# Patient Record
Sex: Female | Born: 1985 | Smoking: Never smoker
Health system: Southern US, Community
[De-identification: ages and names within clinical notes are randomized; demographics above are authoritative.]

## PROBLEM LIST (undated history)

## (undated) ENCOUNTER — Inpatient Hospital Stay: Payer: Self-pay

## (undated) DIAGNOSIS — F419 Anxiety disorder, unspecified: Secondary | ICD-10-CM

## (undated) DIAGNOSIS — F32A Depression, unspecified: Secondary | ICD-10-CM

## (undated) DIAGNOSIS — D649 Anemia, unspecified: Secondary | ICD-10-CM

## (undated) DIAGNOSIS — N814 Uterovaginal prolapse, unspecified: Secondary | ICD-10-CM

## (undated) HISTORY — DX: Depression, unspecified: F32.A

## (undated) HISTORY — PX: CHOLECYSTECTOMY: SHX55

## (undated) HISTORY — DX: Anxiety disorder, unspecified: F41.9

## (undated) HISTORY — DX: Uterovaginal prolapse, unspecified: N81.4

---

## 2015-12-29 HISTORY — PX: WISDOM TOOTH EXTRACTION: SHX21

## 2020-04-11 DIAGNOSIS — O1490 Unspecified pre-eclampsia, unspecified trimester: Secondary | ICD-10-CM

## 2021-04-15 ENCOUNTER — Emergency Department
Admission: EM | Admit: 2021-04-15 | Discharge: 2021-04-15 | Disposition: A | Discharge status: Home / Self Care | Payer: Medicaid Other | Attending: Emergency Medicine | Admitting: Emergency Medicine

## 2021-04-15 ENCOUNTER — Other Ambulatory Visit: Payer: Self-pay

## 2021-04-15 DIAGNOSIS — T7840XA Allergy, unspecified, initial encounter: Secondary | ICD-10-CM | POA: Insufficient documentation

## 2021-04-15 DIAGNOSIS — J029 Acute pharyngitis, unspecified: Secondary | ICD-10-CM | POA: Diagnosis not present

## 2021-04-15 DIAGNOSIS — R590 Localized enlarged lymph nodes: Secondary | ICD-10-CM | POA: Insufficient documentation

## 2021-04-15 NOTE — Discharge Instructions (Signed)
You can continue with salt water gargles.  Take ibuprofen 600 mg every 6 hours as needed with food.  You may take Benadryl at nighttime and Claritin during the day.  Make sure you are drinking lots of fluids.  Return to the ER for any fevers worsening symptoms or urgent changes in your health

## 2021-04-15 NOTE — ED Provider Notes (Signed)
[facility] EMERGENCY DEPARTMENT Provider Note   CSN: [id number] Arrival date & time: 04/15/21  2208     History Chief Complaint  Patient presents with  . Sore Throat    [name] is a 35 y.o. female presents to the emergency department evaluation of sore throat.  She said a sore throat for a week.  No fevers, chills, body aches, cough congestion or runny nose.  She does have a some drainage in the posterior pharynx that she feels throughout the day.  She is got some relief with gargling salt water.  She has not take any other medications.  She has been on amoxicillin for a UTI.  Currently denies any urinary tract symptoms, abdominal pain or fevers.  She is tolerating p.o. well.  HPI     History reviewed. No pertinent past medical history.  There are no problems to display for this patient.   History reviewed. No pertinent surgical history.   OB History   No obstetric history on file.     No family history on file.  Social History   Tobacco Use  . Smoking status: Never Smoker  . Smokeless tobacco: Never Used  Substance Use Topics  . Alcohol use: Never  . Drug use: Never    Home Medications Prior to Admission medications   Not on File    Allergies    Patient has no allergy information on record.  Review of Systems   Review of Systems  Constitutional: Negative for chills, fatigue and fever.  HENT: Positive for postnasal drip and sore throat. Negative for congestion and trouble swallowing.   Respiratory: Negative for cough and shortness of breath.   Gastrointestinal: Negative for abdominal pain, nausea and vomiting.  Genitourinary: Negative for dysuria.  Skin: Negative for rash and wound.  Neurological: Negative for headaches.    Physical Exam Updated Vital Signs BP 111/68   Pulse (!) 50   Temp 97.8 F (36.6 C) (Oral)   Resp 16   Ht 5\' 1"  (1.549 m)   Wt 63.5 kg   SpO2 97%   BMI 26.45 kg/m   Physical  Exam Constitutional:      General: She is not in acute distress.    Appearance: She is well-developed.  HENT:     Head: Normocephalic and atraumatic.     Jaw: No trismus.     Right Ear: Hearing, ear canal and external ear normal.     Left Ear: Hearing, ear canal and external ear normal.     Nose: No rhinorrhea.     Mouth/Throat:     Mouth: No oral lesions.     Pharynx: Oropharynx is clear. Uvula midline. No pharyngeal swelling, oropharyngeal exudate, posterior oropharyngeal erythema or uvula swelling.     Tonsils: No tonsillar exudate or tonsillar abscesses.  Eyes:     General:        Right eye: No discharge.        Left eye: No discharge.     Conjunctiva/sclera: Conjunctivae normal.  Cardiovascular:     Rate and Rhythm: Normal rate and regular rhythm.     Heart sounds: Normal heart sounds.  Pulmonary:     Effort: Pulmonary effort is normal. No respiratory distress.     Breath sounds: Normal breath sounds. No stridor. No wheezing or rales.  Abdominal:     General: There is no distension.     Palpations: Abdomen is soft.     Tenderness: There is no abdominal tenderness.  Musculoskeletal:        General: No deformity. Normal range of motion.     Cervical back: Normal range of motion and neck supple.  Lymphadenopathy:     Cervical: Cervical adenopathy (mild posterior cervical lymphadenopathy) present.  Skin:    General: Skin is warm and dry.  Neurological:     General: No focal deficit present.     Mental Status: She is alert and oriented to person, place, and time.     Deep Tendon Reflexes: Reflexes are normal and symmetric.  Psychiatric:        Mood and Affect: Mood normal.        Behavior: Behavior normal.        Thought Content: Thought content normal.     ED Results / Procedures / Treatments   Labs (all labs ordered are listed, but only abnormal results are displayed) Labs Reviewed - No data to display  EKG None  Radiology No results  found.  Procedures Procedures   Medications Ordered in ED Medications - No data to display  ED Course  I have reviewed the triage vital signs and the nursing notes.  Pertinent labs & imaging results that were available during my care of the patient were reviewed by me and considered in my medical decision making (see chart for details).    MDM Rules/Calculators/A&P                          35 year old female with 1 week of sore throat.  No other symptoms except for postnasal drip.  She has gotten some relief with salt water gargles.  Physical exam shows no peritonsillar abscess, swelling, exudates.  Pharyngeal exam is normal.  She is tolerating p.o. well.  Mild posterior cervical lymphadenopathy.  Recommend patient take ibuprofen as needed for pain and start taking over-the-counter antihistamine.  This does not appear to be any type of bacterial pharyngitis.  She understands signs symptoms return to the ER for. Final Clinical Impression(s) / ED Diagnoses Final diagnoses:  Pharyngitis, unspecified etiology  Allergy, initial encounter    Rx / DC Orders ED Discharge Orders    None       [name] 04/15/21 2230    Delton Prairie, MD 04/16/21 2318

## 2021-04-15 NOTE — ED Triage Notes (Addendum)
Pt states pain to the back of her throat for aprox a week, pt states pain is on left side. Denies trouble swallowing or breathign. Denies cough/fever

## 2021-07-06 ENCOUNTER — Emergency Department
Admission: EM | Admit: 2021-07-06 | Discharge: 2021-07-06 | Discharge status: Against Medical Advice | Payer: Medicaid Other

## 2021-07-06 NOTE — ED Notes (Signed)
Called pt twice for triage without answer.

## 2021-07-06 NOTE — ED Notes (Signed)
Pt called again for triage without answer

## 2021-07-09 ENCOUNTER — Emergency Department: Payer: Medicaid Other

## 2021-07-09 ENCOUNTER — Encounter
Admission: EM | Disposition: A | Discharge status: Home / Self Care | Payer: Self-pay | Source: Home / Self Care | Attending: Emergency Medicine

## 2021-07-09 ENCOUNTER — Emergency Department: Payer: Medicaid Other | Admitting: Anesthesiology

## 2021-07-09 ENCOUNTER — Other Ambulatory Visit: Payer: Self-pay

## 2021-07-09 ENCOUNTER — Ambulatory Visit
Admission: EM | Admit: 2021-07-09 | Discharge: 2021-07-09 | Disposition: A | Discharge status: Home / Self Care | Payer: Medicaid Other | Attending: Emergency Medicine | Admitting: Emergency Medicine

## 2021-07-09 ENCOUNTER — Encounter: Payer: Self-pay | Admitting: Emergency Medicine

## 2021-07-09 DIAGNOSIS — N83201 Unspecified ovarian cyst, right side: Secondary | ICD-10-CM | POA: Diagnosis not present

## 2021-07-09 DIAGNOSIS — O99891 Other specified diseases and conditions complicating pregnancy: Secondary | ICD-10-CM | POA: Insufficient documentation

## 2021-07-09 DIAGNOSIS — K661 Hemoperitoneum: Secondary | ICD-10-CM

## 2021-07-09 DIAGNOSIS — Z98891 History of uterine scar from previous surgery: Secondary | ICD-10-CM | POA: Diagnosis not present

## 2021-07-09 DIAGNOSIS — N939 Abnormal uterine and vaginal bleeding, unspecified: Secondary | ICD-10-CM

## 2021-07-09 DIAGNOSIS — O00102 Left tubal pregnancy without intrauterine pregnancy: Secondary | ICD-10-CM | POA: Insufficient documentation

## 2021-07-09 DIAGNOSIS — Z3A01 Less than 8 weeks gestation of pregnancy: Secondary | ICD-10-CM | POA: Diagnosis not present

## 2021-07-09 DIAGNOSIS — O3481 Maternal care for other abnormalities of pelvic organs, first trimester: Secondary | ICD-10-CM | POA: Diagnosis not present

## 2021-07-09 HISTORY — PX: LAPAROSCOPIC OVARIAN CYSTECTOMY: SHX6248

## 2021-07-09 HISTORY — PX: DIAGNOSTIC LAPAROSCOPY WITH REMOVAL OF ECTOPIC PREGNANCY: SHX6449

## 2021-07-09 LAB — COMPREHENSIVE METABOLIC PANEL
ALT: 58 U/L — ABNORMAL HIGH (ref 0–44)
AST: 29 U/L (ref 15–41)
Albumin: 4.1 g/dL (ref 3.5–5.0)
Alkaline Phosphatase: 55 U/L (ref 38–126)
Anion gap: 10 (ref 5–15)
BUN: 8 mg/dL (ref 6–20)
CO2: 23 mmol/L (ref 22–32)
Calcium: 9.1 mg/dL (ref 8.9–10.3)
Chloride: 102 mmol/L (ref 98–111)
Creatinine, Ser: 0.6 mg/dL (ref 0.44–1.00)
GFR, Estimated: >60 mL/min (ref >60)
Glucose, Bld: 110 mg/dL — ABNORMAL HIGH (ref 70–99)
Potassium: 3.6 mmol/L (ref 3.5–5.1)
Sodium: 135 mmol/L (ref 135–145)
Total Bilirubin: 1.4 mg/dL — ABNORMAL HIGH (ref 0.3–1.2)
Total Protein: 7.5 g/dL (ref 6.5–8.1)

## 2021-07-09 LAB — CBC
HCT: 37.7 % (ref 36.0–46.0)
Hemoglobin: 13.1 g/dL (ref 12.0–15.0)
MCH: 32.6 pg (ref 26.0–34.0)
MCHC: 34.7 g/dL (ref 30.0–36.0)
MCV: 93.8 fL (ref 80.0–100.0)
Platelets: 259 10*3/uL (ref 150–400)
RBC: 4.02 MIL/uL (ref 3.87–5.11)
RDW: 13.8 % (ref 11.5–15.5)
WBC: 17.4 10*3/uL — ABNORMAL HIGH (ref 4.0–10.5)
nRBC: 0 % (ref 0.0–0.2)

## 2021-07-09 LAB — BPAM RBC
Blood Product Expiration Date: 202208112359
Blood Product Expiration Date: 202208122359
Blood Product Expiration Date: 202208122359
Blood Product Expiration Date: 202208122359
Unit Type and Rh: 5100
Unit Type and Rh: 5100
Unit Type and Rh: 5100
Unit Type and Rh: 5100

## 2021-07-09 LAB — PREPARE RBC (CROSSMATCH)

## 2021-07-09 LAB — TYPE AND SCREEN
ABO/RH(D): O POS
Antibody Screen: NEGATIVE
Unit division: 0
Unit division: 0
Unit division: 0
Unit division: 0

## 2021-07-09 LAB — URINALYSIS, COMPLETE (UACMP) WITH MICROSCOPIC
Bacteria, UA: NONE SEEN
Bilirubin Urine: NEGATIVE
Glucose, UA: NEGATIVE mg/dL
Hgb urine dipstick: NEGATIVE
Ketones, ur: 20 mg/dL — AB
Leukocytes,Ua: NEGATIVE
Nitrite: NEGATIVE
Protein, ur: 30 mg/dL — AB
Specific Gravity, Urine: 1.028 (ref 1.005–1.030)
pH: 6 (ref 5.0–8.0)

## 2021-07-09 LAB — ABO/RH: ABO/RH(D): O POS

## 2021-07-09 LAB — HCG, QUANTITATIVE, PREGNANCY: hCG, Beta Chain, Quant, S: 536 m[IU]/mL — ABNORMAL HIGH (ref <5)

## 2021-07-09 LAB — LIPASE, BLOOD: Lipase: 24 U/L (ref 11–51)

## 2021-07-09 LAB — POC URINE PREG, ED: Preg Test, Ur: POSITIVE — AB

## 2021-07-09 SURGERY — LAPAROSCOPY, WITH ECTOPIC PREGNANCY SURGICAL TREATMENT
Anesthesia: General | Site: Abdomen | Laterality: Right

## 2021-07-09 MED ORDER — DIPHENHYDRAMINE HCL 50 MG/ML IJ SOLN
INTRAMUSCULAR | Status: AC
  Administered 2021-07-09: 12.5 mg via INTRAVENOUS
  Filled 2021-07-09: qty 1

## 2021-07-09 MED ORDER — MIDAZOLAM HCL 2 MG/2ML IJ SOLN
INTRAMUSCULAR | Status: AC
  Filled 2021-07-09: qty 2

## 2021-07-09 MED ORDER — DEXMEDETOMIDINE (PRECEDEX) IN NS 20 MCG/5ML (4 MCG/ML) IV SYRINGE
PREFILLED_SYRINGE | INTRAVENOUS | Status: DC | PRN
  Administered 2021-07-09: 4 ug via INTRAVENOUS

## 2021-07-09 MED ORDER — OXYCODONE HCL 5 MG PO TABS: 5.0000 mg | ORAL_TABLET | Freq: Once | ORAL | Status: DC | PRN

## 2021-07-09 MED ORDER — LIDOCAINE HCL (CARDIAC) PF 100 MG/5ML IV SOSY
PREFILLED_SYRINGE | INTRAVENOUS | Status: DC | PRN
  Administered 2021-07-09: 100 mg via INTRAVENOUS

## 2021-07-09 MED ORDER — PROMETHAZINE HCL 25 MG/ML IJ SOLN
INTRAMUSCULAR | Status: AC
  Filled 2021-07-09: qty 1

## 2021-07-09 MED ORDER — PROPOFOL 10 MG/ML IV BOLUS
INTRAVENOUS | Status: DC | PRN
  Administered 2021-07-09: 150 mg via INTRAVENOUS

## 2021-07-09 MED ORDER — MORPHINE SULFATE (PF) 4 MG/ML IV SOLN
4.0000 mg | Freq: Once | INTRAVENOUS | Status: AC
  Administered 2021-07-09: 4 mg via INTRAVENOUS
  Filled 2021-07-09: qty 1

## 2021-07-09 MED ORDER — SODIUM CHLORIDE FLUSH 0.9 % IV SOLN
INTRAVENOUS | Status: AC
  Filled 2021-07-09: qty 6

## 2021-07-09 MED ORDER — KETOROLAC TROMETHAMINE 30 MG/ML IJ SOLN
INTRAMUSCULAR | Status: DC | PRN
  Administered 2021-07-09: 30 mg via INTRAVENOUS

## 2021-07-09 MED ORDER — BUPIVACAINE HCL 0.5 % IJ SOLN
INTRAMUSCULAR | Status: DC | PRN
  Administered 2021-07-09: 9 mL

## 2021-07-09 MED ORDER — ONDANSETRON HCL 4 MG/2ML IJ SOLN
4.0000 mg | Freq: Once | INTRAMUSCULAR | Status: AC
  Administered 2021-07-09: 4 mg via INTRAVENOUS
  Filled 2021-07-09: qty 2

## 2021-07-09 MED ORDER — ONDANSETRON HCL 4 MG/2ML IJ SOLN
INTRAMUSCULAR | Status: DC | PRN
  Administered 2021-07-09: 4 mg via INTRAVENOUS

## 2021-07-09 MED ORDER — ROCURONIUM BROMIDE 100 MG/10ML IV SOLN
INTRAVENOUS | Status: DC | PRN
  Administered 2021-07-09: 40 mg via INTRAVENOUS
  Administered 2021-07-09: 10 mg via INTRAVENOUS

## 2021-07-09 MED ORDER — LACTATED RINGERS IV SOLN: INTRAVENOUS | Status: DC | PRN

## 2021-07-09 MED ORDER — ACETAMINOPHEN 10 MG/ML IV SOLN
INTRAVENOUS | Status: DC | PRN
  Administered 2021-07-09: 1000 mg via INTRAVENOUS

## 2021-07-09 MED ORDER — FENTANYL CITRATE (PF) 100 MCG/2ML IJ SOLN
INTRAMUSCULAR | Status: AC
  Filled 2021-07-09: qty 2

## 2021-07-09 MED ORDER — SODIUM CHLORIDE 0.9 % IV SOLN: 10.0000 mL/h | Freq: Once | INTRAVENOUS | Status: DC

## 2021-07-09 MED ORDER — SUCCINYLCHOLINE CHLORIDE 20 MG/ML IJ SOLN
INTRAMUSCULAR | Status: DC | PRN
  Administered 2021-07-09: 120 mg via INTRAVENOUS

## 2021-07-09 MED ORDER — MIDAZOLAM HCL 2 MG/2ML IJ SOLN
INTRAMUSCULAR | Status: DC | PRN
  Administered 2021-07-09: 2 mg via INTRAVENOUS

## 2021-07-09 MED ORDER — SODIUM CHLORIDE FLUSH 0.9 % IV SOLN
INTRAVENOUS | Status: AC
  Filled 2021-07-09: qty 10

## 2021-07-09 MED ORDER — PROMETHAZINE HCL 25 MG/ML IJ SOLN
6.2500 mg | INTRAMUSCULAR | Status: AC
  Administered 2021-07-09: 6.25 mg via INTRAVENOUS

## 2021-07-09 MED ORDER — ACETAMINOPHEN 10 MG/ML IV SOLN: 1000.0000 mg | Freq: Once | INTRAVENOUS | Status: DC | PRN

## 2021-07-09 MED ORDER — FENTANYL CITRATE (PF) 100 MCG/2ML IJ SOLN
INTRAMUSCULAR | Status: DC | PRN
  Administered 2021-07-09 (×2): 50 ug via INTRAVENOUS

## 2021-07-09 MED ORDER — DEXAMETHASONE SODIUM PHOSPHATE 10 MG/ML IJ SOLN
INTRAMUSCULAR | Status: DC | PRN
  Administered 2021-07-09: 10 mg via INTRAVENOUS

## 2021-07-09 MED ORDER — ONDANSETRON HCL 4 MG/2ML IJ SOLN: 4.0000 mg | Freq: Once | INTRAMUSCULAR | Status: DC | PRN

## 2021-07-09 MED ORDER — PROPOFOL 10 MG/ML IV BOLUS
INTRAVENOUS | Status: AC
  Filled 2021-07-09: qty 20

## 2021-07-09 MED ORDER — SODIUM CHLORIDE 0.9 % IV SOLN: Freq: Once | INTRAVENOUS | Status: AC

## 2021-07-09 MED ORDER — SUGAMMADEX SODIUM 200 MG/2ML IV SOLN
INTRAVENOUS | Status: DC | PRN
  Administered 2021-07-09: 200 mg via INTRAVENOUS

## 2021-07-09 MED ORDER — OXYCODONE HCL 5 MG/5ML PO SOLN: 5.0000 mg | Freq: Once | ORAL | Status: DC | PRN

## 2021-07-09 MED ORDER — FENTANYL CITRATE (PF) 100 MCG/2ML IJ SOLN: 25.0000 ug | INTRAMUSCULAR | Status: DC | PRN

## 2021-07-09 MED ORDER — DIPHENHYDRAMINE HCL 50 MG/ML IJ SOLN: 12.5000 mg | INTRAMUSCULAR | Status: AC

## 2021-07-09 SURGICAL SUPPLY — 41 items
BACTOSHIELD CHG 4% 4OZ (MISCELLANEOUS) ×1
BAG URINE DRAIN 2000ML AR STRL (UROLOGICAL SUPPLIES) ×3 IMPLANT
BLADE SURG SZ11 CARB STEEL (BLADE) ×3 IMPLANT
CANISTER SUCT 1200ML W/VALVE (MISCELLANEOUS) ×3 IMPLANT
CATH ROBINSON RED A/P 16FR (CATHETERS) ×3 IMPLANT
CHLORAPREP W/TINT 26 (MISCELLANEOUS) ×3 IMPLANT
DERMABOND ADVANCED (GAUZE/BANDAGES/DRESSINGS) ×1
DERMABOND ADVANCED .7 DNX12 (GAUZE/BANDAGES/DRESSINGS) ×2 IMPLANT
DRSG TEGADERM 2-3/8X2-3/4 SM (GAUZE/BANDAGES/DRESSINGS) ×9 IMPLANT
GAUZE 4X4 16PLY ~~LOC~~+RFID DBL (SPONGE) ×3 IMPLANT
GLOVE SURG SYN 8.0 (GLOVE) ×6 IMPLANT
GOWN STRL REUS W/ TWL LRG LVL3 (GOWN DISPOSABLE) ×2 IMPLANT
GOWN STRL REUS W/ TWL XL LVL3 (GOWN DISPOSABLE) ×4 IMPLANT
GOWN STRL REUS W/TWL LRG LVL3 (GOWN DISPOSABLE) ×1
GOWN STRL REUS W/TWL XL LVL3 (GOWN DISPOSABLE) ×2
GRASPER SUT TROCAR 14GX15 (MISCELLANEOUS) ×3 IMPLANT
IRRIGATION STRYKERFLOW (MISCELLANEOUS) ×2 IMPLANT
IRRIGATOR STRYKERFLOW (MISCELLANEOUS) ×3
IV NS 1000ML (IV SOLUTION) ×1
IV NS 1000ML BAXH (IV SOLUTION) ×2 IMPLANT
KIT TURNOVER CYSTO (KITS) ×3 IMPLANT
LABEL OR SOLS (LABEL) ×3 IMPLANT
MANIFOLD NEPTUNE II (INSTRUMENTS) ×3 IMPLANT
NS IRRIG 500ML POUR BTL (IV SOLUTION) ×3 IMPLANT
PACK GYN LAPAROSCOPIC (MISCELLANEOUS) ×3 IMPLANT
PAD OB MATERNITY 4.3X12.25 (PERSONAL CARE ITEMS) ×3 IMPLANT
PAD PREP 24X41 OB/GYN DISP (PERSONAL CARE ITEMS) ×3 IMPLANT
POUCH SPECIMEN RETRIEVAL 10MM (ENDOMECHANICALS) ×3 IMPLANT
SCRUB CHG 4% DYNA-HEX 4OZ (MISCELLANEOUS) ×2 IMPLANT
SET TUBE SMOKE EVAC HIGH FLOW (TUBING) ×3 IMPLANT
SHEARS HARMONIC ACE PLUS 36CM (ENDOMECHANICALS) ×3 IMPLANT
SLEEVE ENDOPATH XCEL 5M (ENDOMECHANICALS) ×3 IMPLANT
SPONGE GAUZE 2X2 8PLY STRL LF (GAUZE/BANDAGES/DRESSINGS) ×9 IMPLANT
STRIP CLOSURE SKIN 1/4X4 (GAUZE/BANDAGES/DRESSINGS) ×3 IMPLANT
SUT VIC AB 0 CT1 36 (SUTURE) ×3 IMPLANT
SUT VIC AB 2-0 UR6 27 (SUTURE) IMPLANT
SUT VIC AB 4-0 SH 27 (SUTURE) ×1
SUT VIC AB 4-0 SH 27XANBCTRL (SUTURE) ×2 IMPLANT
SWABSTK COMLB BENZOIN TINCTURE (MISCELLANEOUS) ×3 IMPLANT
TROCAR ENDO BLADELESS 11MM (ENDOMECHANICALS) ×3 IMPLANT
TROCAR XCEL NON-BLD 5MMX100MML (ENDOMECHANICALS) ×3 IMPLANT

## 2021-07-09 NOTE — Anesthesia Preprocedure Evaluation (Signed)
Anesthesia Evaluation  Patient identified by MRN, date of birth, ID band Patient awake  General Assessment Comment:   Ruptured ectopic, in visible severe pain.  Reviewed: Allergy & Precautions, NPO status , Patient's Chart, lab work & pertinent test results  History of Anesthesia Complications Negative for: history of anesthetic complications  Airway Mallampati: II  TM Distance: >3 FB Neck ROM: Full    Dental  (+) Teeth Intact, Missing,    Pulmonary neg pulmonary ROS, neg sleep apnea, neg COPD, Patient abstained from smoking.Not current smoker,    Pulmonary exam normal breath sounds clear to auscultation       Cardiovascular Exercise Tolerance: Good METS(-) hypertension(-) CAD and (-) Past MI negative cardio ROS  (-) dysrhythmias  Rhythm:Regular Rate:Normal - Systolic murmurs    Neuro/Psych negative neurological ROS  negative psych ROS   GI/Hepatic neg GERD  ,(+)     (-) substance abuse  ,   Endo/Other  neg diabetes  Renal/GU negative Renal ROS     Musculoskeletal   Abdominal   Peds  Hematology   Anesthesia Other Findings History reviewed. No pertinent past medical history.  Reproductive/Obstetrics (+) Pregnancy                             Anesthesia Physical Anesthesia Plan  ASA: 2 and emergent  Anesthesia Plan: General   Post-op Pain Management:    Induction: Intravenous and Rapid sequence  PONV Risk Score and Plan: 4 or greater and Ondansetron, Dexamethasone and Midazolam  Airway Management Planned: Oral ETT  Additional Equipment: None  Intra-op Plan:   Post-operative Plan: Extubation in OR  Informed Consent: I have reviewed the patients History and Physical, chart, labs and discussed the procedure including the risks, benefits and alternatives for the proposed anesthesia with the patient or authorized representative who has indicated his/her understanding and  acceptance.     Dental advisory given  Plan Discussed with: CRNA and Surgeon  Anesthesia Plan Comments: (Discussed risks of anesthesia with patient, including PONV, sore throat, lip/dental damage. Rare risks discussed as well, such as cardiorespiratory and neurological sequelae. Patient understands.)        Anesthesia Quick Evaluation
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
## 2021-07-09 NOTE — ED Provider Notes (Signed)
Care assumed from patient's PA.  Ultrasound calls back showing what appears to be a ruptured ectopic.  Patient is in pain she currently is hemodynamically stable.  We will get a little bit of pain medicine for her and begin a type and cross etc. for preop.  I am paging Dr. Feliberto Gottron the OB/GYN right now.   Arnaldo Natal, MD 07/09/21 [phone]
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
## 2021-07-09 NOTE — Discharge Instructions (Addendum)
AMBULATORY SURGERY  DISCHARGE INSTRUCTIONS   The drugs that you were given will stay in your system until tomorrow so for the next 24 hours you should not:  Drive an automobile Make any legal decisions Drink any alcoholic beverage   You may resume regular meals tomorrow.  Today it is better to start with liquids and gradually work up to solid foods.  You may eat anything you prefer, but it is better to start with liquids, then soup and crackers, and gradually work up to solid foods.   Please notify your doctor immediately if you have any unusual bleeding, trouble breathing, redness and pain at the surgery site, drainage, fever, or pain not relieved by medication.     Please call to schedule your post-operative visit.  Additional Instructions:    

## 2021-07-09 NOTE — ED Notes (Signed)
Pt complaining of abdominal pain. Pt states pain is a 10. RN notified.

## 2021-07-09 NOTE — ED Notes (Signed)
Patient transported to Ultrasound 

## 2021-07-09 NOTE — ED Notes (Signed)
Patient transported to the OR 

## 2021-07-09 NOTE — ED Notes (Signed)
Blood consent signed by patient and this RN.  

## 2021-07-09 NOTE — Op Note (Signed)
[name], [name] MEDICAL RECORD NO: [id number] ACCOUNT NO: [id number] DATE OF BIRTH: 10-24-1986 FACILITY: [facility] LOCATION: [facility] PHYSICIAN: Suzy Bouchard, MD  Operative Report   DATE OF PROCEDURE: 07/09/2021  PREOPERATIVE DIAGNOSES: 1.  Ruptured left tubal ectopic pregnancy. 2.  Hemoperitoneum.  POSTOPERATIVE DIAGNOSES: 1.  Left tubal ectopic pregnancy. 2.  Pelvic adhesions. 3.  Right ovarian cyst.  PROCEDURE:  1.  Laparoscopic left salpingectomy. 2.  Lysis of adhesions. 3.  Right ovarian cystotomy.  ANESTHESIA:  General endotracheal anesthesia.  SURGEON:  Jennell Corner, MD  INDICATIONS:  A 35 year old gravida 6, para 5 patient presented to the emergency department with abdominal pain.  Positive pregnancy test, quantitative hCG of 530, an ultrasound that showed hemoperitoneum and a left adnexal mass concerning for an ectopic  pregnancy that was ruptured.  DESCRIPTION OF PROCEDURE:  After adequate general endotracheal anesthesia, the patient's abdomen, perineum and vagina were prepped and draped in sterile fashion.  Timeout was performed.  Straight catheterization of the bladder yielded 75 mL dark urine.   Sponge stick was placed into the posterior vaginal vault to be used for uterine manipulation during the procedure.  Gloves and gown were changed.  Attention was then directed to the patient's abdomen where a 5 mm infraumbilical incision was made after  injecting with 0.5% Marcaine.  The 5 mm laparoscope was advanced into the abdominal cavity under direct visualization with the Optiview cannula.  The patient's abdomen was insufflated.  There was noted to be an omental adhesion anteriorly inferior to the  umbilical port site.  A second port was placed left lower quadrant 3 cm medial to the left anterior iliac spine and an 11 mm trocar was advanced under direct visualization.  Third port site was placed right lower quadrant, again 3 cm medial to the right   anterior iliac spine.  A 5 mm trocar was advanced into the abdominal cavity under direct visualization.  Initial impression revealed free flowing non-clotted blood and a dilated violaceous left tube with bulging centrally consistent with an ectopic  pregnancy.  Harmonic scalpel was brought up to the operative field.  The omental adhesion was taken down followed by grasping of the distal portion of the left fallopian tube and placing this on medial traction.  Harmonic scalpel was used to remove the  left fallopian tube intact with the ectopic pregnancy.  The patient had a 4 cm right ovarian cyst.  Harmonic scalpel was used to open the cyst wall and clear fluid drained from this.  The patient's abdomen was irrigated and suctioned.  The Endobag was  placed in the abdominal cavity and the left fallopian tube with ectopic was removed through this. Intra-abdominal pressure was lowered to 7 mmHg.  Good hemostasis was noted.  The cone and PMI apparatus was used to close the left lower port site, closing  the fascia.  The patient's abdomen was then deflated and all ports were removed.  Each incision was closed with interrupted 4-0 Vicryl suture.  Sterile dressing applied.  Vaginal sponge stick was removed.  Intraoperative pictures were then printed.  The  patient tolerated the procedure well.  No complications.  ESTIMATED BLOOD LOSS:  Minimal with 50 mL hemoperitoneum that was encountered prior to the procedure.  INTRAOPERATIVE FLUIDS:  800 mL.  URINE OUTPUT:  75 mL.  The patient was taken to recovery room in good condition.   SHW D: 07/09/2021 7:03:54 pm T: 07/09/2021 8:39:00 pm  JOB: [id number]/ [id number]

## 2021-07-09 NOTE — ED Triage Notes (Signed)
Pt comes into the ED via POV c/o generalized abdominal pain and vaginal bleeding.  Pt states that she is about 6-[redacted] weeks pregnant and she just completed an [place] because they were concerned for an ectopic pregnancy.  Pt states they did not find a a fetus in the [place].  Pt coming back with continued pain.  Pt in NAd with even and unlabored respirations.  Pt also c/o body aches.

## 2021-07-09 NOTE — ED Notes (Signed)
Report given to OR nurse

## 2021-07-09 NOTE — ED Triage Notes (Signed)
First Nurse Note:  C/O vaginal bleeding and abdominal pain.  Patient has been seen for same through [facility].    Positive urine preg 7/8 and 7/10 HCG:  7/10 - 948 7/12 - 522 7/12 - 485  US done 7/10-- inconclusive for IUP vs ectopic

## 2021-07-09 NOTE — Transfer of Care (Signed)
Immediate Anesthesia Transfer of Care Note  Patient: [name]  Procedure(s) Performed: DIAGNOSTIC LAPAROSCOPY WITH REMOVAL OF ECTOPIC PREGNANCY; LEFT SALPINGECTOMY (Left: Abdomen) LAPAROSCOPIC OVARIAN CYSTECTOMY (Right)  Patient Location: PACU  Anesthesia Type:General  Level of Consciousness: drowsy and patient cooperative  Airway & Oxygen Therapy: Patient Spontanous Breathing and Patient connected to face mask oxygen  Post-op Assessment: Report given to RN and Post -op Vital signs reviewed and stable  Post vital signs: Reviewed and stable  Last Vitals:  Vitals Value Taken Time  BP 107/61 07/09/21 1856  Temp 36.7 C 07/09/21 1853  Pulse 76 07/09/21 1856  Resp 20 07/09/21 1856  SpO2 100 % 07/09/21 1856  Vitals shown include unvalidated device data.  Last Pain:  Vitals:   07/09/21 1716  TempSrc:   PainSc: 10-Worst pain ever         Complications: No notable events documented.

## 2021-07-09 NOTE — Anesthesia Procedure Notes (Signed)
Procedure Name: Intubation Date/Time: 07/09/2021 6:09 PM Performed by: Nelda Marseille, CRNA Pre-anesthesia Checklist: Patient identified, Patient being monitored, Timeout performed, Emergency Drugs available and Suction available Patient Re-evaluated:Patient Re-evaluated prior to induction Oxygen Delivery Method: Circle system utilized Preoxygenation: Pre-oxygenation with 100% oxygen Induction Type: IV induction Ventilation: Mask ventilation without difficulty Laryngoscope Size: Mac, 3 and McGraph Grade View: Grade I Tube type: Oral Tube size: 6.5 mm Number of attempts: 1 Airway Equipment and Method: Stylet Placement Confirmation: ETT inserted through vocal cords under direct vision, positive ETCO2 and breath sounds checked- equal and bilateral Secured at: 21 cm Tube secured with: Tape Dental Injury: Teeth and Oropharynx as per pre-operative assessment

## 2021-07-09 NOTE — Brief Op Note (Signed)
07/09/2021  6:38 PM  PATIENT:  [name]  35 y.o. female  PRE-OPERATIVE DIAGNOSIS:  Ruptured Ectopic Pregnancy, left tubal   POST-OPERATIVE DIAGNOSIS:  Ruptured Ectopic Pregnancy,left tubal   PROCEDURE:  Procedure(s): DIAGNOSTIC LAPAROSCOPY WITH REMOVAL OF ECTOPIC PREGNANCY (Left). Left salpingectomy , lysis of adhesions and right ovarian cystotomy   SURGEON:  Surgeon(s) and Role:    * Letica Giaimo, Ihor Austin, MD - Primary  PHYSICIAN ASSISTANT: CST  ASSISTANTS: none   ANESTHESIA:   general  EBL:  50 mL ( hemoperitoneum )  IOF- 800 cc UO 75 cc  BLOOD ADMINISTERED:none  DRAINS: none   LOCAL MEDICATIONS USED:  MARCAINE     SPECIMEN:  Source of Specimen:  left fallopian tube with ectopic  DISPOSITION OF SPECIMEN:  PATHOLOGY  COUNTS:  YES  TOURNIQUET:  * No tourniquets in log *  DICTATION: .Other Dictation: Dictation Number verbal  PLAN OF CARE: Discharge to home after PACU  PATIENT DISPOSITION:  PACU - hemodynamically stable.   Delay start of Pharmacological VTE agent (>24hrs) due to surgical blood loss or risk of bleeding: not applicable

## 2021-07-10 ENCOUNTER — Encounter: Payer: Self-pay | Admitting: Obstetrics and Gynecology

## 2021-07-10 MED ORDER — SODIUM CHLORIDE 0.9 % IR SOLN
Status: DC | PRN
  Administered 2021-07-09: 1000 mL

## 2021-07-10 NOTE — Anesthesia Postprocedure Evaluation (Signed)
Anesthesia Post Note  Patient: Kali Deadwyler  Procedure(s) Performed: DIAGNOSTIC LAPAROSCOPY WITH REMOVAL OF ECTOPIC PREGNANCY; LEFT SALPINGECTOMY (Left: Abdomen) LAPAROSCOPIC OVARIAN CYSTECTOMY (Right)  Patient location during evaluation: PACU Anesthesia Type: General Level of consciousness: awake and alert Pain management: pain level controlled Vital Signs Assessment: post-procedure vital signs reviewed and stable Respiratory status: spontaneous breathing, nonlabored ventilation, respiratory function stable and patient connected to nasal cannula oxygen Cardiovascular status: blood pressure returned to baseline and stable Postop Assessment: no apparent nausea or vomiting Anesthetic complications: no   No notable events documented.   Last Vitals:  Vitals:   07/09/21 1944 07/09/21 2006  BP: 123/79 123/86  Pulse: 62 74  Resp: 13 20  Temp: 36.9 C 36.7 C  SpO2: 100% 100%    Last Pain:  Vitals:   07/09/21 1930  TempSrc:   PainSc: 0-No pain                 Corinda Gubler

## 2021-07-11 LAB — SURGICAL PATHOLOGY

## 2021-08-04 ENCOUNTER — Emergency Department: Payer: Medicaid Other

## 2021-08-04 ENCOUNTER — Other Ambulatory Visit: Payer: Self-pay

## 2021-08-04 ENCOUNTER — Emergency Department
Admission: EM | Admit: 2021-08-04 | Discharge: 2021-08-04 | Disposition: A | Discharge status: Home / Self Care | Payer: Medicaid Other | Attending: Emergency Medicine | Admitting: Emergency Medicine

## 2021-08-04 DIAGNOSIS — R079 Chest pain, unspecified: Secondary | ICD-10-CM | POA: Diagnosis not present

## 2021-08-04 DIAGNOSIS — Z5321 Procedure and treatment not carried out due to patient leaving prior to being seen by health care provider: Secondary | ICD-10-CM | POA: Insufficient documentation

## 2021-08-04 LAB — HEPATIC FUNCTION PANEL
ALT: 13 U/L (ref 0–44)
AST: 18 U/L (ref 15–41)
Albumin: 4 g/dL (ref 3.5–5.0)
Alkaline Phosphatase: 56 U/L (ref 38–126)
Bilirubin, Direct: 0.1 mg/dL (ref 0.0–0.2)
Indirect Bilirubin: 0.9 mg/dL (ref 0.3–0.9)
Total Bilirubin: 1 mg/dL (ref 0.3–1.2)
Total Protein: 7 g/dL (ref 6.5–8.1)

## 2021-08-04 LAB — BASIC METABOLIC PANEL
Anion gap: 6 (ref 5–15)
BUN: 12 mg/dL (ref 6–20)
CO2: 25 mmol/L (ref 22–32)
Calcium: 9 mg/dL (ref 8.9–10.3)
Chloride: 107 mmol/L (ref 98–111)
Creatinine, Ser: 0.74 mg/dL (ref 0.44–1.00)
GFR, Estimated: >60 mL/min (ref >60)
Glucose, Bld: 98 mg/dL (ref 70–99)
Potassium: 4.1 mmol/L (ref 3.5–5.1)
Sodium: 138 mmol/L (ref 135–145)

## 2021-08-04 LAB — CBC
HCT: 38.2 % (ref 36.0–46.0)
Hemoglobin: 13.1 g/dL (ref 12.0–15.0)
MCH: 32.4 pg (ref 26.0–34.0)
MCHC: 34.3 g/dL (ref 30.0–36.0)
MCV: 94.6 fL (ref 80.0–100.0)
Platelets: 272 10*3/uL (ref 150–400)
RBC: 4.04 MIL/uL (ref 3.87–5.11)
RDW: 13.3 % (ref 11.5–15.5)
WBC: 8.8 10*3/uL (ref 4.0–10.5)
nRBC: 0 % (ref 0.0–0.2)

## 2021-08-04 LAB — TROPONIN I (HIGH SENSITIVITY): Troponin I (High Sensitivity): <2 ng/L (ref <18)

## 2021-08-04 NOTE — ED Notes (Signed)
Pt called from lobby for additional blood collection, no answer at this time, pt not in BR or waiting outside.

## 2021-08-04 NOTE — ED Triage Notes (Signed)
Pt arrives via POV with CC of CP that began 15 minutes ago with associated feelings of radiation to R arm and jaw. Denies any pain at this time. Pt reports concern due to having fallopian tube removal on 07/09/2021.

## 2021-08-04 NOTE — ED Notes (Signed)
Placed phone call to pt that states she left because of her children.  States she is leaving them with her mother and intends to come back.  Pt informed that her wait time will begin again when she returns and that the hospital is not responsible for any decline in her health while she is gone.  Pt verbalizes understanding of all information share.

## 2021-09-22 ENCOUNTER — Ambulatory Visit: Payer: Medicaid Other

## 2021-12-04 ENCOUNTER — Other Ambulatory Visit: Payer: Self-pay

## 2021-12-04 ENCOUNTER — Encounter: Payer: Self-pay | Admitting: Emergency Medicine

## 2021-12-04 ENCOUNTER — Emergency Department: Payer: Medicaid Other

## 2021-12-04 ENCOUNTER — Emergency Department
Admission: EM | Admit: 2021-12-04 | Discharge: 2021-12-04 | Disposition: A | Discharge status: Home / Self Care | Payer: Medicaid Other | Attending: Emergency Medicine | Admitting: Emergency Medicine

## 2021-12-04 DIAGNOSIS — R102 Pelvic and perineal pain: Secondary | ICD-10-CM | POA: Diagnosis present

## 2021-12-04 DIAGNOSIS — R110 Nausea: Secondary | ICD-10-CM | POA: Diagnosis not present

## 2021-12-04 DIAGNOSIS — R001 Bradycardia, unspecified: Secondary | ICD-10-CM | POA: Diagnosis not present

## 2021-12-04 DIAGNOSIS — F17290 Nicotine dependence, other tobacco product, uncomplicated: Secondary | ICD-10-CM | POA: Insufficient documentation

## 2021-12-04 DIAGNOSIS — D72829 Elevated white blood cell count, unspecified: Secondary | ICD-10-CM | POA: Diagnosis not present

## 2021-12-04 LAB — CBC WITH DIFFERENTIAL/PLATELET
Abs Immature Granulocytes: 0.05 10*3/uL (ref 0.00–0.07)
Basophils Absolute: 0.1 10*3/uL (ref 0.0–0.1)
Basophils Relative: 1 %
Eosinophils Absolute: 0.2 10*3/uL (ref 0.0–0.5)
Eosinophils Relative: 2 %
HCT: 39.4 % (ref 36.0–46.0)
Hemoglobin: 13.3 g/dL (ref 12.0–15.0)
Immature Granulocytes: 0 %
Lymphocytes Relative: 31 %
Lymphs Abs: 4 10*3/uL (ref 0.7–4.0)
MCH: 31.8 pg (ref 26.0–34.0)
MCHC: 33.8 g/dL (ref 30.0–36.0)
MCV: 94.3 fL (ref 80.0–100.0)
Monocytes Absolute: 1.2 10*3/uL — ABNORMAL HIGH (ref 0.1–1.0)
Monocytes Relative: 9 %
Neutro Abs: 7.2 10*3/uL (ref 1.7–7.7)
Neutrophils Relative %: 57 %
Platelets: 285 10*3/uL (ref 150–400)
RBC: 4.18 MIL/uL (ref 3.87–5.11)
RDW: 13.5 % (ref 11.5–15.5)
WBC: 12.7 10*3/uL — ABNORMAL HIGH (ref 4.0–10.5)
nRBC: 0 % (ref 0.0–0.2)

## 2021-12-04 LAB — POC URINE PREG, ED: Preg Test, Ur: NEGATIVE

## 2021-12-04 LAB — COMPREHENSIVE METABOLIC PANEL
ALT: 13 U/L (ref 0–44)
AST: 16 U/L (ref 15–41)
Albumin: 4.4 g/dL (ref 3.5–5.0)
Alkaline Phosphatase: 51 U/L (ref 38–126)
Anion gap: 10 (ref 5–15)
BUN: 15 mg/dL (ref 6–20)
CO2: 26 mmol/L (ref 22–32)
Calcium: 9.9 mg/dL (ref 8.9–10.3)
Chloride: 102 mmol/L (ref 98–111)
Creatinine, Ser: 0.69 mg/dL (ref 0.44–1.00)
GFR, Estimated: >60 mL/min (ref >60)
Glucose, Bld: 75 mg/dL (ref 70–99)
Potassium: 3.6 mmol/L (ref 3.5–5.1)
Sodium: 138 mmol/L (ref 135–145)
Total Bilirubin: 0.8 mg/dL (ref 0.3–1.2)
Total Protein: 7.5 g/dL (ref 6.5–8.1)

## 2021-12-04 LAB — CHLAMYDIA/NGC RT PCR (ARMC ONLY)
Chlamydia Tr: NOT DETECTED
N gonorrhoeae: NOT DETECTED

## 2021-12-04 LAB — WET PREP, GENITAL
Clue Cells Wet Prep HPF POC: NONE SEEN
Sperm: NONE SEEN
Trich, Wet Prep: NONE SEEN
WBC, Wet Prep HPF POC: >10 — AB (ref <10)
Yeast Wet Prep HPF POC: NONE SEEN

## 2021-12-04 LAB — URINALYSIS, COMPLETE (UACMP) WITH MICROSCOPIC
Bilirubin Urine: NEGATIVE
Glucose, UA: NEGATIVE mg/dL
Ketones, ur: NEGATIVE mg/dL
Nitrite: NEGATIVE
RBC / HPF: >50 RBC/hpf (ref 0–5)
Specific Gravity, Urine: >1.03 — ABNORMAL HIGH (ref 1.005–1.030)
pH: 5.5 (ref 5.0–8.0)

## 2021-12-04 LAB — LIPASE, BLOOD: Lipase: 32 U/L (ref 11–51)

## 2021-12-04 MED ORDER — KETOROLAC TROMETHAMINE 30 MG/ML IJ SOLN
30.0000 mg | Freq: Once | INTRAMUSCULAR | Status: AC
  Administered 2021-12-04: 30 mg via INTRAMUSCULAR
  Filled 2021-12-04: qty 1

## 2021-12-04 MED ORDER — FLUCONAZOLE 150 MG PO TABS: 150.0000 mg | ORAL_TABLET | Freq: Every day | ORAL | 0 refills | Status: DC

## 2021-12-04 NOTE — ED Provider Notes (Signed)
[facility]  ____________________________________________  Time seen: Approximately 9:18 PM  I have reviewed the triage vital signs and the nursing notes.   HISTORY  Chief Complaint Abdominal Pain   Historian Patient     HPI [name] is a 35 y.o. female with history of salpingectomy on the right and history of ovarian cyst on the right, presents to the emergency department with sudden onset left sided pelvic pain.  Patient states that she has had some nausea but no vomiting.  No dysuria, hematuria or increased urinary frequency.  No low back pain.  Patient denies possibility of pregnancy.  No chest pain, chest tightness or shortness of breath.   History reviewed. No pertinent past medical history.   Immunizations up to date:  Yes.     History reviewed. No pertinent past medical history.  There are no problems to display for this patient.   Past Surgical History:  Procedure Laterality Date   CESAREAN SECTION     DIAGNOSTIC LAPAROSCOPY WITH REMOVAL OF ECTOPIC PREGNANCY Left 07/09/2021   Procedure: DIAGNOSTIC LAPAROSCOPY WITH REMOVAL OF ECTOPIC PREGNANCY; LEFT SALPINGECTOMY;  Surgeon: Schermerhorn, Ihor Austin, MD;  Location: [facility] ORS;  Service: Gynecology;  Laterality: Left;   LAPAROSCOPIC OVARIAN CYSTECTOMY Right 07/09/2021   Procedure: LAPAROSCOPIC OVARIAN CYSTECTOMY;  Surgeon: Schermerhorn, Ihor Austin, MD;  Location: [facility] ORS;  Service: Gynecology;  Laterality: Right;    Prior to Admission medications   Medication Sig Start Date End Date Taking? Authorizing Provider  fluconazole (DIFLUCAN) 150 MG tablet Take 1 tablet (150 mg total) by mouth daily. 12/04/21  Yes Pia Mau M, PA-C  Cholecalciferol (VITAMIN D) 125 MCG (5000 UT) CAPS Take 1 capsule by mouth daily.    [provider]  FLUoxetine (PROZAC) 10 MG capsule Take 10 mg by mouth daily. 03/14/21   [provider]  Multiple Vitamin (MULTIVITAMIN) tablet Take 1 tablet by mouth  daily.    [provider]    Allergies Patient has no known allergies.  History reviewed. No pertinent family history.  Social History Social History   Tobacco Use   Smoking status: Every Day    Types: E-cigarettes   Smokeless tobacco: Never  Substance Use Topics   Alcohol use: Never   Drug use: Never     Review of Systems  Constitutional: No fever/chills Eyes:  No discharge ENT: No upper respiratory complaints. Respiratory: no cough. No SOB/ use of accessory muscles to breath Gastrointestinal:   No nausea, no vomiting.  No diarrhea.  No constipation. Genitourinary: Patient has pelvic pain.  Musculoskeletal: Negative for musculoskeletal pain. Skin: Negative for rash, abrasions, lacerations, ecchymosis.   ____________________________________________   PHYSICAL EXAM:  VITAL SIGNS: ED Triage Vitals [12/04/21 2056]  Enc Vitals Group     BP (!) 118/94     Pulse Rate (!) 57     Resp 20     Temp 97.9 F (36.6 C)     Temp Source Oral     SpO2 99 %     Weight 140 lb (63.5 kg)     Height 5\' 1"  (1.549 m)     Head Circumference      Peak Flow      Pain Score 10     Pain Loc      Pain Edu?      Excl. in GC?      Constitutional: Alert and oriented. Well appearing and in no acute distress. Eyes: Conjunctivae are normal. PERRL. EOMI. Head: Atraumatic. ENT:  Cardiovascular: Normal  rate, regular rhythm. Normal S1 and S2.  Good peripheral circulation. Respiratory: Normal respiratory effort without tachypnea or retractions. Lungs CTAB. Good air entry to the bases with no decreased or absent breath sounds Gastrointestinal: Bowel sounds x 4 quadrants. Patient is tender in left lower quadrant. No guarding or rigidity. No distention. Musculoskeletal: Full range of motion to all extremities. No obvious deformities noted Neurologic:  Normal for age. No gross focal neurologic deficits are appreciated.  Skin:  Skin is warm, dry and intact. No rash noted. Psychiatric:  Mood and affect are normal for age. Speech and behavior are normal.   ____________________________________________   LABS (all labs ordered are listed, but only abnormal results are displayed)  Labs Reviewed  WET PREP, GENITAL - Abnormal; Notable for the following components:      Result Value   WBC, Wet Prep HPF POC >10 (*)    All other components within normal limits  CBC WITH DIFFERENTIAL/PLATELET - Abnormal; Notable for the following components:   WBC 12.7 (*)    Monocytes Absolute 1.2 (*)    All other components within normal limits  URINALYSIS, COMPLETE (UACMP) WITH MICROSCOPIC - Abnormal; Notable for the following components:   APPearance HAZY (*)    Specific Gravity, Urine >1.030 (*)    Hgb urine dipstick LARGE (*)    Protein, ur TRACE (*)    Leukocytes,Ua SMALL (*)    Bacteria, UA RARE (*)    All other components within normal limits  CHLAMYDIA/NGC RT PCR ([facility] ONLY)            COMPREHENSIVE METABOLIC PANEL  LIPASE, BLOOD  POC URINE PREG, ED   ____________________________________________  EKG   ____________________________________________  RADIOLOGY Unk Pinto, personally viewed and evaluated these images (plain radiographs) as part of my medical decision making, as well as reviewing the written report by the radiologist.    US PELVIC COMPLETE W TRANSVAGINAL AND TORSION R/O  Result Date: 12/04/2021 CLINICAL DATA:  Left lower quadrant pain EXAM: TRANSABDOMINAL AND TRANSVAGINAL ULTRASOUND OF PELVIS DOPPLER ULTRASOUND OF OVARIES TECHNIQUE: Both transabdominal and transvaginal ultrasound examinations of the pelvis were performed. Transabdominal technique was performed for global imaging of the pelvis including uterus, ovaries, adnexal regions, and pelvic cul-de-sac. It was necessary to proceed with endovaginal exam following the transabdominal exam to visualize the ovaries. Color and duplex Doppler ultrasound was utilized to evaluate blood flow to the ovaries.  COMPARISON:  None. FINDINGS: Uterus Measurements: 7.6 x 3.6 x 5.9 cm. = volume: 84 mL. No fibroids or other mass visualized. Endometrium Thickness: 6 mm.  No focal abnormality visualized. Right ovary Measurements: 2.7 x 1.6 x 1.8 cm. = volume: 4 mL. Normal appearance/no adnexal mass. Left ovary Measurements: 3.1 x 2.3 x 2.3 cm. = volume: 8.5 mL. Small corpus luteum cyst measuring up to 23.3 cm is noted. Pulsed Doppler evaluation of both ovaries demonstrates normal low-resistance arterial and venous waveforms. Other findings No abnormal free fluid. IMPRESSION: No acute abnormality noted. Electronically Signed   By: Inez Catalina M.D.   On: 12/04/2021 22:35    ____________________________________________    PROCEDURES  Procedure(s) performed:     Procedures     Medications  ketorolac (TORADOL) 30 MG/ML injection 30 mg (30 mg Intramuscular Given 12/04/21 2315)     ____________________________________________   INITIAL IMPRESSION / ASSESSMENT AND PLAN / ED COURSE  Pertinent labs & imaging results that were available during my care of the patient were reviewed by me and considered in my medical  decision making (see chart for details).      Assessment and Plan:  Pelvic pain 35 year old female presents to the emergency department with left-sided pelvic pain that started acutely tonight.  Patient has been straining.  She was mildly bradycardic but vital signs were otherwise reassuring.  Patient had mildly elevated white blood cell count.  Urinalysis indicated a large amount of blood and trace leukocytes with rare bacteria.  Dedicated pelvic ultrasound showed no evidence of torsion or other acute abnormality.  We will treat with IM Toradol and Keflex for early UTI.  Diflucan was also prescribed.   ____________________________________________  FINAL CLINICAL IMPRESSION(S) / ED DIAGNOSES  Final diagnoses:  Pelvic pain      NEW MEDICATIONS STARTED DURING THIS VISIT:  ED Discharge  Orders          Ordered    fluconazole (DIFLUCAN) 150 MG tablet  Daily        12/04/21 2259                This chart was dictated using voice recognition software/Dragon. Despite best efforts to proofread, errors can occur which can change the meaning. Any change was purely unintentional.     Lannie Fields, PA-C 12/04/21 2316    Carrie Mew, MD 12/06/21 [phone]

## 2021-12-04 NOTE — ED Triage Notes (Addendum)
Pt to ED via POV with c/o sudden onset LLQ abd pain, pt states several months ago had L salpingectomy and R ovarian cyst. Pt denies urinary symptoms. Pt appears uncomfortable in triage at this time. Pt also c/o vaginal discharge with a foul odor. Pt states pain radiates down her thighs.

## 2021-12-04 NOTE — Discharge Instructions (Addendum)
Take Keflex four times daily for the next seven days.  

## 2021-12-26 ENCOUNTER — Other Ambulatory Visit: Payer: Self-pay

## 2021-12-26 ENCOUNTER — Ambulatory Visit
Admission: EM | Admit: 2021-12-26 | Discharge: 2021-12-26 | Disposition: A | Discharge status: Home / Self Care | Payer: Medicaid Other | Attending: Emergency Medicine | Admitting: Emergency Medicine

## 2021-12-26 DIAGNOSIS — J069 Acute upper respiratory infection, unspecified: Secondary | ICD-10-CM | POA: Diagnosis not present

## 2021-12-26 MED ORDER — BENZONATATE 100 MG PO CAPS: 200.0000 mg | ORAL_CAPSULE | Freq: Three times a day (TID) | ORAL | 0 refills | Status: DC

## 2021-12-26 MED ORDER — PROMETHAZINE-PHENYLEPHRINE 6.25-5 MG/5ML PO SYRP: 5.0000 mL | ORAL_SOLUTION | Freq: Four times a day (QID) | ORAL | 0 refills | Status: DC | PRN

## 2021-12-26 MED ORDER — IPRATROPIUM BROMIDE 0.06 % NA SOLN: 2.0000 | Freq: Four times a day (QID) | NASAL | 12 refills | Status: DC

## 2021-12-26 NOTE — ED Provider Notes (Signed)
[facility]    CSN: [id number] Arrival date & time: 12/26/21  1828      History   Chief Complaint Chief Complaint  Patient presents with   Sore Throat    HPI [name] is a 35 y.o. female.   HPI  35 year old female here for evaluation of sore throat.  Patient notes that she has been experiencing a sore throat off and on for the last 10 days that is associated with nasal congestion and runny nose, ear fullness, and a cough that increases at night.  She states that the cough is productive first in the morning and later in the evening.  She denies fever, nausea, vomiting, or diarrhea.  History reviewed. No pertinent past medical history.  There are no problems to display for this patient.   Past Surgical History:  Procedure Laterality Date   CESAREAN SECTION     DIAGNOSTIC LAPAROSCOPY WITH REMOVAL OF ECTOPIC PREGNANCY Left 07/09/2021   Procedure: DIAGNOSTIC LAPAROSCOPY WITH REMOVAL OF ECTOPIC PREGNANCY; LEFT SALPINGECTOMY;  Surgeon: Schermerhorn, Ihor Austin, MD;  Location: [facility];  Service: Gynecology;  Laterality: Left;   LAPAROSCOPIC OVARIAN CYSTECTOMY Right 07/09/2021   Procedure: LAPAROSCOPIC OVARIAN CYSTECTOMY;  Surgeon: Schermerhorn, Ihor Austin, MD;  Location: [facility];  Service: Gynecology;  Laterality: Right;    OB History     Gravida  1   Para      Term      Preterm      AB      Living         SAB      IAB      Ectopic      Multiple      Live Births               Home Medications    Prior to Admission medications   Medication Sig Start Date End Date Taking? Authorizing Provider  benzonatate (TESSALON) 100 MG capsule Take 2 capsules (200 mg total) by mouth every 8 (eight) hours. 12/26/21  Yes Becky Augusta, NP  Cholecalciferol (VITAMIN D) 125 MCG (5000 UT) CAPS Take 1 capsule by mouth daily.   Yes [provider]  ipratropium (ATROVENT) 0.06 % nasal spray Place 2 sprays into both nostrils 4 (four) times daily.  12/26/21  Yes Becky Augusta, NP  Multiple Vitamin (MULTIVITAMIN) tablet Take 1 tablet by mouth daily.   Yes [provider]  promethazine-phenylephrine (PROMETHAZINE VC) 6.25-5 MG/5ML SYRP Take 5 mLs by mouth every 6 (six) hours as needed for congestion. 12/26/21  Yes Becky Augusta, NP    Family History History reviewed. No pertinent family history.  Social History Social History   Tobacco Use   Smoking status: Every Day    Types: E-cigarettes   Smokeless tobacco: Never  Substance Use Topics   Alcohol use: Never   Drug use: Never     Allergies   Patient has no known allergies.   Review of Systems Review of Systems  Constitutional:  Negative for activity change, appetite change and fever.  HENT:  Positive for congestion, ear pain, postnasal drip, rhinorrhea and sore throat.   Respiratory:  Positive for cough. Negative for shortness of breath and wheezing.   Gastrointestinal:  Negative for diarrhea, nausea and vomiting.  Skin:  Negative for rash.  Hematological: Negative.   Psychiatric/Behavioral: Negative.      Physical Exam Triage Vital Signs ED Triage Vitals  Enc Vitals Group     BP 12/26/21 1845 111/69  Pulse Rate 12/26/21 1845 (!) 55     Resp 12/26/21 1845 18     Temp 12/26/21 1845 98.5 F (36.9 C)     Temp Source 12/26/21 1845 Oral     SpO2 12/26/21 1845 100 %     Weight 12/26/21 1841 140 lb (63.5 kg)     Height 12/26/21 1841 5\' 1"  (1.549 m)     Head Circumference --      Peak Flow --      Pain Score 12/26/21 1840 7     Pain Loc --      Pain Edu? --      Excl. in GC? --    No data found.  Updated Vital Signs BP 111/69    Pulse (!) 55    Temp 98.5 F (36.9 C) (Oral)    Resp 18    Ht 5\' 1"  (1.549 m)    Wt 140 lb (63.5 kg)    LMP 12/23/2021    SpO2 100%    BMI 26.45 kg/m   Visual Acuity Right Eye Distance:   Left Eye Distance:   Bilateral Distance:    Right Eye Near:   Left Eye Near:    Bilateral Near:     Physical Exam Vitals and  nursing note reviewed.  Constitutional:      General: She is not in acute distress.    Appearance: Normal appearance. She is normal weight. She is not ill-appearing.  HENT:     Head: Normocephalic and atraumatic.     Right Ear: Tympanic membrane, ear canal and external ear normal. There is no impacted cerumen.     Left Ear: Tympanic membrane, ear canal and external ear normal. There is no impacted cerumen.     Nose: Congestion and rhinorrhea present.     Mouth/Throat:     Mouth: Mucous membranes are moist.     Pharynx: Oropharynx is clear. Posterior oropharyngeal erythema present.  Cardiovascular:     Rate and Rhythm: Normal rate and regular rhythm.     Pulses: Normal pulses.     Heart sounds: Normal heart sounds. No murmur heard.   No gallop.  Pulmonary:     Effort: Pulmonary effort is normal.     Breath sounds: Normal breath sounds. No wheezing, rhonchi or rales.  Musculoskeletal:     Cervical back: Normal range of motion and neck supple.  Lymphadenopathy:     Cervical: No cervical adenopathy.  Skin:    General: Skin is warm and dry.     Capillary Refill: Capillary refill takes less than 2 seconds.     Findings: No erythema or rash.  Neurological:     General: No focal deficit present.     Mental Status: She is alert and oriented to person, place, and time.  Psychiatric:        Mood and Affect: Mood normal.        Behavior: Behavior normal.        Thought Content: Thought content normal.        Judgment: Judgment normal.     UC Treatments / Results  Labs (all labs ordered are listed, but only abnormal results are displayed) Labs Reviewed - No data to display  EKG   Radiology No results found.  Procedures Procedures (including critical care time)  Medications Ordered in UC Medications - No data to display  Initial Impression / Assessment and Plan / UC Course  I have reviewed the triage vital signs and the  nursing notes.  Pertinent labs & imaging results  that were available during my care of the patient were reviewed by me and considered in my medical decision making (see chart for details).  Patient is a nontoxic-appearing 35 year old female here for evaluation of off-and-on sore throat over the last 10 days since associated with runny nose, nasal congestion, postnasal drip, and a cough.  Her physical exam reveals pearly gray tympanic membranes bilaterally with normal light reflex and clear external auditory canals.  Nasal mucosa is erythematous and edematous with clear nasal discharge in both nares.  Oropharyngeal exam reveals unremarkable tonsillar pillars.  The posterior oropharynx is erythematous and injected with clear postnasal drip.  No cervical lymphadenopathy appreciated exam.  Cardiopulmonary exam shows clear lung sounds in all fields.  Patient Damas consistent with a viral upper respiratory infection cough.  Will treat with Atrovent nasal spray, Tessalon Perles, and Promethazine DM cough syrup.   Final Clinical Impressions(s) / UC Diagnoses   Final diagnoses:  Viral URI with cough     Discharge Instructions      Use the Atrovent nasal spray, 2 squirts in each nostril every 6 hours, as needed for runny nose and postnasal drip.  Use the Tessalon Perles every 8 hours during the day.  Take them with a small sip of water.  They may give you some numbness to the base of your tongue or a metallic taste in your mouth, this is normal.  Use the Promethazine VC cough syrup at bedtime for cough and congestion.  It will make you drowsy so do not take it during the day.  Return for reevaluation or see your primary care provider for any new or worsening symptoms.      ED Prescriptions     Medication Sig Dispense Auth. Provider   ipratropium (ATROVENT) 0.06 % nasal spray Place 2 sprays into both nostrils 4 (four) times daily. 15 mL Becky Augusta, NP   benzonatate (TESSALON) 100 MG capsule Take 2 capsules (200 mg total) by mouth every 8  (eight) hours. 21 capsule Becky Augusta, NP   promethazine-phenylephrine (PROMETHAZINE VC) 6.25-5 MG/5ML SYRP Take 5 mLs by mouth every 6 (six) hours as needed for congestion. 180 mL Becky Augusta, NP      PDMP not reviewed this encounter.   Becky Augusta, NP 12/26/21 1911

## 2021-12-26 NOTE — ED Triage Notes (Signed)
Pt here with C/O sore throat for 10 days, comes and goes. Had a "head cold" but has since went away.

## 2021-12-26 NOTE — Discharge Instructions (Signed)
Use the Atrovent nasal spray, 2 squirts in each nostril every 6 hours, as needed for runny nose and postnasal drip.  Use the Tessalon Perles every 8 hours during the day.  Take them with a small sip of water.  They may give you some numbness to the base of your tongue or a metallic taste in your mouth, this is normal.  Use the Promethazine VC cough syrup at bedtime for cough and congestion.  It will make you drowsy so do not take it during the day.  Return for reevaluation or see your primary care provider for any new or worsening symptoms.

## 2022-01-06 ENCOUNTER — Ambulatory Visit
Admission: EM | Admit: 2022-01-06 | Discharge: 2022-01-06 | Disposition: A | Discharge status: Home / Self Care | Payer: Medicaid Other | Attending: Emergency Medicine | Admitting: Emergency Medicine

## 2022-01-06 ENCOUNTER — Encounter: Payer: Self-pay | Admitting: Emergency Medicine

## 2022-01-06 ENCOUNTER — Other Ambulatory Visit: Payer: Self-pay

## 2022-01-06 DIAGNOSIS — M545 Low back pain, unspecified: Secondary | ICD-10-CM | POA: Insufficient documentation

## 2022-01-06 LAB — URINALYSIS, COMPLETE (UACMP) WITH MICROSCOPIC
Bilirubin Urine: NEGATIVE
Glucose, UA: NEGATIVE mg/dL
Ketones, ur: NEGATIVE mg/dL
Leukocytes,Ua: NEGATIVE
Nitrite: NEGATIVE
Protein, ur: NEGATIVE mg/dL
Specific Gravity, Urine: 1.025 (ref 1.005–1.030)
pH: 5.5 (ref 5.0–8.0)

## 2022-01-06 MED ORDER — BACLOFEN 10 MG PO TABS: 10.0000 mg | ORAL_TABLET | Freq: Three times a day (TID) | ORAL | 0 refills | Status: DC

## 2022-01-06 MED ORDER — IBUPROFEN 600 MG PO TABS: 600.0000 mg | ORAL_TABLET | Freq: Four times a day (QID) | ORAL | 0 refills | Status: DC | PRN

## 2022-01-06 NOTE — ED Provider Notes (Signed)
[facility]    CSN: [id number] Arrival date & time: 01/06/22  0801      History   Chief Complaint Chief Complaint  Patient presents with   Abdominal Pain    HPI [name] is a 36 y.o. female.   HPI  36 year old female here for evaluation of right flank pain.  Patient reports that she is having right flank pain and lower back pain that started 3 days ago after drinking heavily.  She states that this is associated with chills, nausea, and some burning with urination.  She denies fever, urinary urgency or frequency.  She also denies any blood in her urine.  She states she has not been drinking as much fluid as she should.  She was diagnosed with a UTI 2 days ago and placed on Keflex but she indicates that she never picked up her prescription.  Son reports that she was diagnosed with an upper respiratory infection and placed on Keflex however I cannot find any records in epic.  I can also not find records of her urgent care visit where she was diagnosed with a UTI.  History reviewed. No pertinent past medical history.  There are no problems to display for this patient.   Past Surgical History:  Procedure Laterality Date   CESAREAN SECTION     DIAGNOSTIC LAPAROSCOPY WITH REMOVAL OF ECTOPIC PREGNANCY Left 07/09/2021   Procedure: DIAGNOSTIC LAPAROSCOPY WITH REMOVAL OF ECTOPIC PREGNANCY; LEFT SALPINGECTOMY;  Surgeon: Schermerhorn, Gwen Her, MD;  Location: [facility];  Service: Gynecology;  Laterality: Left;   LAPAROSCOPIC OVARIAN CYSTECTOMY Right 07/09/2021   Procedure: LAPAROSCOPIC OVARIAN CYSTECTOMY;  Surgeon: Schermerhorn, Gwen Her, MD;  Location: [facility];  Service: Gynecology;  Laterality: Right;    OB History     Gravida  1   Para      Term      Preterm      AB      Living         SAB      IAB      Ectopic      Multiple      Live Births               Home Medications    Prior to Admission medications   Medication Sig Start Date  End Date Taking? Authorizing Provider  baclofen (LIORESAL) 10 MG tablet Take 1 tablet (10 mg total) by mouth 3 (three) times daily. 01/06/22  Yes Margarette Canada, NP  ibuprofen (ADVIL) 600 MG tablet Take 1 tablet (600 mg total) by mouth every 6 (six) hours as needed. 01/06/22  Yes Margarette Canada, NP  benzonatate (TESSALON) 100 MG capsule Take 2 capsules (200 mg total) by mouth every 8 (eight) hours. 12/26/21   Margarette Canada, NP  cefixime (SUPRAX) 400 MG CAPS capsule Take 400 mg by mouth once. 01/04/22   [provider]  Cholecalciferol (VITAMIN D) 125 MCG (5000 UT) CAPS Take 1 capsule by mouth daily.    [provider]  ipratropium (ATROVENT) 0.06 % nasal spray Place 2 sprays into both nostrils 4 (four) times daily. 12/26/21   Margarette Canada, NP  Multiple Vitamin (MULTIVITAMIN) tablet Take 1 tablet by mouth daily.    [provider]  promethazine-phenylephrine (PROMETHAZINE VC) 6.25-5 MG/5ML SYRP Take 5 mLs by mouth every 6 (six) hours as needed for congestion. 12/26/21   Margarette Canada, NP    Family History History reviewed. No pertinent family history.  Social History Social History  Tobacco Use   Smoking status: Every Day    Types: E-cigarettes   Smokeless tobacco: Never  Substance Use Topics   Alcohol use: Never   Drug use: Never     Allergies   Patient has no known allergies.   Review of Systems Review of Systems  Constitutional:  Positive for chills. Negative for activity change, appetite change and fever.  Gastrointestinal:  Negative for abdominal pain, nausea and vomiting.  Genitourinary:  Positive for dysuria and flank pain. Negative for frequency, hematuria and urgency.  Musculoskeletal:  Positive for back pain.  Hematological: Negative.   Psychiatric/Behavioral: Negative.      Physical Exam Triage Vital Signs ED Triage Vitals  Enc Vitals Group     BP      Pulse      Resp      Temp      Temp src      SpO2      Weight      Height      Head  Circumference      Peak Flow      Pain Score      Pain Loc      Pain Edu?      Excl. in [place]?    No data found.  Updated Vital Signs BP 118/90 (BP Location: Left Arm)    Pulse 80    Temp 97.9 F (36.6 C) (Oral)    Resp 16    Ht 5\' 1"  (1.549 m)    Wt 139 lb 15.9 oz (63.5 kg)    LMP 12/23/2021    SpO2 100%    BMI 26.45 kg/m   Visual Acuity Right Eye Distance:   Left Eye Distance:   Bilateral Distance:    Right Eye Near:   Left Eye Near:    Bilateral Near:     Physical Exam Vitals and nursing note reviewed.  Constitutional:      General: She is not in acute distress.    Appearance: Normal appearance. She is not ill-appearing.  HENT:     Head: Normocephalic and atraumatic.  Cardiovascular:     Rate and Rhythm: Normal rate and regular rhythm.     Pulses: Normal pulses.     Heart sounds: Normal heart sounds. No murmur heard.   No friction rub. No gallop.  Pulmonary:     Effort: Pulmonary effort is normal.     Breath sounds: Normal breath sounds. No wheezing, rhonchi or rales.  Abdominal:     General: Abdomen is flat. Bowel sounds are normal.     Palpations: Abdomen is soft.     Tenderness: There is no abdominal tenderness. There is right CVA tenderness. There is no left CVA tenderness, guarding or rebound.  Skin:    General: Skin is warm and dry.     Capillary Refill: Capillary refill takes less than 2 seconds.     Findings: No erythema or rash.  Neurological:     General: No focal deficit present.     Mental Status: She is alert and oriented to person, place, and time.  Psychiatric:        Mood and Affect: Mood normal.        Behavior: Behavior normal.        Thought Content: Thought content normal.        Judgment: Judgment normal.     UC Treatments / Results  Labs (all labs ordered are listed, but only abnormal results are displayed) Labs Reviewed  URINALYSIS, COMPLETE (UACMP) WITH MICROSCOPIC - Abnormal; Notable for the following components:      Result Value    Hgb urine dipstick TRACE (*)    Bacteria, UA RARE (*)    All other components within normal limits    EKG   Radiology No results found.  Procedures Procedures (including critical care time)  Medications Ordered in UC Medications - No data to display  Initial Impression / Assessment and Plan / UC Course  I have reviewed the triage vital signs and the nursing notes.  Pertinent labs & imaging results that were available during my care of the patient were reviewed by me and considered in my medical decision making (see chart for details).  Patient is a nontoxic-appearing 36 year old female here for evaluation of right flank and right lower back pain that started 3 days ago.  She was diagnosed with a UTI and placed on Keflex but she indicates that she never picked up the prescription.  She indicates that she is currently on Levaquin for URI but there is no record available in epic.  She is complaining of nausea, chills, and low back pain, breath or right flank and right low back pain.  Additionally she does endorse some burning with urination but no urinary urgency, frequency, or hematuria.  Her physical exam reveals a benign cardiopulmonary exam with clear lung sounds in all fields.  She does have mild right CVA tenderness on exam.  Abdomen is soft, flat, nontender.  Urinalysis was collected at triage and is pending.  Urinalysis shows trace hemoglobin and rare bacteria.  No leukocyte esterase no nitrites, no protein, and no WBCs.  Suspect patient's back pain may be musculoskeletal in nature and will treat with ibuprofen and baclofen.  Patient states that she has to leave in order to pick up her son from daycare she is being called   Final Clinical Impressions(s) / UC Diagnoses   Final diagnoses:  Acute right-sided low back pain without sciatica     Discharge Instructions      Take the ibuprofen, 600 mg every 6 hours with food, on a schedule for the next 48 hours and then as  needed.  Take the baclofen, 10 mg every 8 hours, on a schedule for the next 48 hours and then as needed.  Apply moist heat to your back for 30 minutes at a time 2-3 times a day to improve blood flow to the area and help remove the lactic acid causing the spasm.  Follow the back exercises given at discharge.  Return for reevaluation for any new or worsening symptoms.      ED Prescriptions     Medication Sig Dispense Auth. Provider   ibuprofen (ADVIL) 600 MG tablet Take 1 tablet (600 mg total) by mouth every 6 (six) hours as needed. 30 tablet Margarette Canada, NP   baclofen (LIORESAL) 10 MG tablet Take 1 tablet (10 mg total) by mouth 3 (three) times daily. 41 each Margarette Canada, NP      PDMP not reviewed this encounter.   Margarette Canada, NP 01/06/22 [phone]

## 2022-01-06 NOTE — ED Triage Notes (Signed)
Pt c/o right upper quadrant back pain after drinking a lot on Saturday. Pt is also being treated for a uti x 2 days.

## 2022-01-06 NOTE — Discharge Instructions (Addendum)
Take the ibuprofen, 600 mg every 6 hours with food, on a schedule for the next 48 hours and then as needed.  Take the baclofen, 10 mg every 8 hours, on a schedule for the next 48 hours and then as needed.  Apply moist heat to your back for 30 minutes at a time 2-3 times a day to improve blood flow to the area and help remove the lactic acid causing the spasm.  Follow the back exercises given at discharge.  Return for reevaluation for any new or worsening symptoms.  

## 2022-01-11 ENCOUNTER — Emergency Department: Payer: Medicaid Other

## 2022-01-11 ENCOUNTER — Encounter: Payer: Self-pay | Admitting: Emergency Medicine

## 2022-01-11 ENCOUNTER — Other Ambulatory Visit: Payer: Self-pay

## 2022-01-11 ENCOUNTER — Emergency Department
Admission: EM | Admit: 2022-01-11 | Discharge: 2022-01-11 | Disposition: A | Discharge status: Home / Self Care | Payer: Medicaid Other | Attending: Emergency Medicine | Admitting: Emergency Medicine

## 2022-01-11 DIAGNOSIS — K8020 Calculus of gallbladder without cholecystitis without obstruction: Secondary | ICD-10-CM | POA: Diagnosis not present

## 2022-01-11 DIAGNOSIS — R1011 Right upper quadrant pain: Secondary | ICD-10-CM

## 2022-01-11 LAB — URINALYSIS, COMPLETE (UACMP) WITH MICROSCOPIC
Bilirubin Urine: NEGATIVE
Glucose, UA: NEGATIVE mg/dL
Hgb urine dipstick: NEGATIVE
Ketones, ur: NEGATIVE mg/dL
Leukocytes,Ua: NEGATIVE
Nitrite: NEGATIVE
Protein, ur: NEGATIVE mg/dL
Specific Gravity, Urine: >1.03 — ABNORMAL HIGH (ref 1.005–1.030)
Squamous Epithelial / HPF: NONE SEEN (ref 0–5)
pH: 5.5 (ref 5.0–8.0)

## 2022-01-11 LAB — COMPREHENSIVE METABOLIC PANEL
ALT: 18 U/L (ref 0–44)
AST: 17 U/L (ref 15–41)
Albumin: 4 g/dL (ref 3.5–5.0)
Alkaline Phosphatase: 52 U/L (ref 38–126)
Anion gap: 5 (ref 5–15)
BUN: 18 mg/dL (ref 6–20)
CO2: 26 mmol/L (ref 22–32)
Calcium: 9.1 mg/dL (ref 8.9–10.3)
Chloride: 104 mmol/L (ref 98–111)
Creatinine, Ser: 0.71 mg/dL (ref 0.44–1.00)
GFR, Estimated: >60 mL/min (ref >60)
Glucose, Bld: 88 mg/dL (ref 70–99)
Potassium: 4.1 mmol/L (ref 3.5–5.1)
Sodium: 135 mmol/L (ref 135–145)
Total Bilirubin: 0.6 mg/dL (ref 0.3–1.2)
Total Protein: 7.2 g/dL (ref 6.5–8.1)

## 2022-01-11 LAB — CBC
HCT: 40.3 % (ref 36.0–46.0)
Hemoglobin: 13.2 g/dL (ref 12.0–15.0)
MCH: 31.7 pg (ref 26.0–34.0)
MCHC: 32.8 g/dL (ref 30.0–36.0)
MCV: 96.6 fL (ref 80.0–100.0)
Platelets: 295 10*3/uL (ref 150–400)
RBC: 4.17 MIL/uL (ref 3.87–5.11)
RDW: 13.5 % (ref 11.5–15.5)
WBC: 6.8 10*3/uL (ref 4.0–10.5)
nRBC: 0 % (ref 0.0–0.2)

## 2022-01-11 LAB — LIPASE, BLOOD: Lipase: 30 U/L (ref 11–51)

## 2022-01-11 LAB — POC URINE PREG, ED: Preg Test, Ur: NEGATIVE

## 2022-01-11 MED ORDER — ONDANSETRON HCL 4 MG PO TABS: 4.0000 mg | ORAL_TABLET | Freq: Every day | ORAL | 1 refills | Status: DC | PRN

## 2022-01-11 NOTE — ED Notes (Signed)
Patient also c/o nausea. Will inform MD.

## 2022-01-11 NOTE — ED Triage Notes (Signed)
Pt reports a week ago went out and drank a lot fo alcohol and the next day she started with pain to her ruq and upper right side of back. Pt reports has been to UC and given meds for a UTI but the pain is still there, constant and stabbing in nature.

## 2022-01-11 NOTE — ED Provider Notes (Signed)
[facility] Provider Note    Event Date/Time   First MD Initiated Contact with Patient 01/11/22 1238     (approximate)   History   Back Pain and Abdominal Pain   HPI  [name] is a 36 y.o. female  abdominal pain started 1 week ago. Pain is located in the RUQ radiating to the back. Pain is worsened by movement. She denies associated N/V, diarrhea, constipation.  Started after she drank alcohol.  Pain relatively constant not worse with eating.  Patient also endorses chronic lower back pain that she has had for a while.  No radiation of symptoms.  Denies incontinence fevers chills history of IV drug use.  Went to UC initially, prescribed baclofen and ibuprofen, as well as abx for UTI.      History reviewed. No pertinent past medical history.  There are no problems to display for this patient.    Physical Exam  Triage Vital Signs: ED Triage Vitals  Enc Vitals Group     BP 01/11/22 1038 121/66     Pulse Rate 01/11/22 1038 74     Resp 01/11/22 1038 18     Temp 01/11/22 1038 98.7 F (37.1 C)     Temp Source 01/11/22 1038 Oral     SpO2 01/11/22 1038 100 %     Weight 01/11/22 1025 141 lb 1.5 oz (64 kg)     Height 01/11/22 1025 5\' 1"  (1.549 m)     Head Circumference --      Peak Flow --      Pain Score 01/11/22 1024 10     Pain Loc --      Pain Edu? --      Excl. in GC? --     Most recent vital signs: Vitals:   01/11/22 1038 01/11/22 1619  BP: 121/66 125/70  Pulse: 74 76  Resp: 18 16  Temp: 98.7 F (37.1 C)   SpO2: 100% 100%     General: Awake, no distress.  CV:  Good peripheral perfusion.  Resp:  Normal effort.  Abd:  No distention.  Abdomen is soft, no tenderness in the right upper quadrant Neuro:             Awake, Alert, Oriented x 3  Other:  No midline lumbar tenderness 5 out of 5 strength in the lower extremities with hip flexion   ED Results / Procedures / Treatments  Labs (all labs ordered are listed, but only  abnormal results are displayed) Labs Reviewed  URINALYSIS, COMPLETE (UACMP) WITH MICROSCOPIC - Abnormal; Notable for the following components:      Result Value   Specific Gravity, Urine >1.030 (*)    Bacteria, UA RARE (*)    All other components within normal limits  LIPASE, BLOOD  COMPREHENSIVE METABOLIC PANEL  CBC  POC URINE PREG, ED     EKG     RADIOLOGY I reviewed the radiology ultrasound that shows cholelithiasis but no signs of cholecystitis agree with radiology report   PROCEDURES:  Critical Care performed: No    MEDICATIONS ORDERED IN ED: Medications - No data to display   IMPRESSION / MDM / ASSESSMENT AND PLAN / ED COURSE  I reviewed the triage vital signs and the nursing notes.                              Differential diagnosis includes, but is not limited to, biliary  colic, cholecystitis, musculoskeletal pain, hepatitis, pancreatitis, kidney stone  36 year old female presenting with right upper quadrant pain.  Patient also endorses back pain but this has been going on for some time and is unrelated to the right upper quadrant pain she is been having for a week.  Started after drinking alcohol.  Has been relatively constant not worse with eating without associated vomiting diarrhea fevers chills.  Vital signs within normal limits.  Patient appears very well on exam.  Her abdomen is benign she is nontender in the right upper quadrant.  I reviewed her labs which are reassuring, she has no leukocytosis normal LFTs and normal lipase.  She is not pregnant and her UA is not consistent with infection.  Right upper quadrant ultrasound was obtained given the location of her pain which shows gallstones but no evidence of cholecystitis.  Gallstones could certainly be causing some of the pain however this would be somewhat unusual given it has been constant is not worse after eating.  Advised her to continue eating ibuprofen and we will also write for Zofran given she does  have some nausea.  She was given the name of a surgeon so that if she continues to have discomfort she can follow-up to discuss elective cholecystectomy.  Clinical Course as of 01/11/22 1643  Sun Jan 11, 2022  1424 Alkaline Phosphatase: 52 [KM]    Clinical Course User Index [KM] Georga Hacking, MD     FINAL CLINICAL IMPRESSION(S) / ED DIAGNOSES   Final diagnoses:  RUQ pain  Gallstones     Rx / DC Orders   ED Discharge Orders          Ordered    ondansetron (ZOFRAN) 4 MG tablet  Daily PRN        01/11/22 1613             Note:  This document was prepared using Dragon voice recognition software and may include unintentional dictation errors.   Georga Hacking, MD 01/11/22 [phone]

## 2022-01-15 ENCOUNTER — Other Ambulatory Visit: Payer: Self-pay

## 2022-01-15 ENCOUNTER — Emergency Department: Payer: Medicaid Other

## 2022-01-15 ENCOUNTER — Emergency Department
Admission: EM | Admit: 2022-01-15 | Discharge: 2022-01-16 | Disposition: A | Discharge status: Home / Self Care | Payer: Medicaid Other | Attending: Emergency Medicine | Admitting: Emergency Medicine

## 2022-01-15 ENCOUNTER — Encounter: Payer: Self-pay | Admitting: Emergency Medicine

## 2022-01-15 DIAGNOSIS — R079 Chest pain, unspecified: Secondary | ICD-10-CM | POA: Diagnosis not present

## 2022-01-15 DIAGNOSIS — R1010 Upper abdominal pain, unspecified: Secondary | ICD-10-CM | POA: Diagnosis not present

## 2022-01-15 DIAGNOSIS — Z5321 Procedure and treatment not carried out due to patient leaving prior to being seen by health care provider: Secondary | ICD-10-CM | POA: Diagnosis not present

## 2022-01-15 LAB — BASIC METABOLIC PANEL
Anion gap: 7 (ref 5–15)
BUN: 16 mg/dL (ref 6–20)
CO2: 29 mmol/L (ref 22–32)
Calcium: 9.8 mg/dL (ref 8.9–10.3)
Chloride: 103 mmol/L (ref 98–111)
Creatinine, Ser: 0.81 mg/dL (ref 0.44–1.00)
GFR, Estimated: >60 mL/min (ref >60)
Glucose, Bld: 69 mg/dL — ABNORMAL LOW (ref 70–99)
Potassium: 4 mmol/L (ref 3.5–5.1)
Sodium: 139 mmol/L (ref 135–145)

## 2022-01-15 LAB — CBC
HCT: 38.7 % (ref 36.0–46.0)
Hemoglobin: 12.7 g/dL (ref 12.0–15.0)
MCH: 31.6 pg (ref 26.0–34.0)
MCHC: 32.8 g/dL (ref 30.0–36.0)
MCV: 96.3 fL (ref 80.0–100.0)
Platelets: 295 10*3/uL (ref 150–400)
RBC: 4.02 MIL/uL (ref 3.87–5.11)
RDW: 13.2 % (ref 11.5–15.5)
WBC: 11.2 10*3/uL — ABNORMAL HIGH (ref 4.0–10.5)
nRBC: 0 % (ref 0.0–0.2)

## 2022-01-15 LAB — LIPASE, BLOOD: Lipase: 31 U/L (ref 11–51)

## 2022-01-15 LAB — TROPONIN I (HIGH SENSITIVITY): Troponin I (High Sensitivity): <2 ng/L (ref <18)

## 2022-01-15 NOTE — ED Triage Notes (Signed)
Pt comes into the ED via POV c/o upper abd pain that is radiating into her chest.  Pt has known gallstones and was seen here last week for it.  Pt states the upper abd pain is getting worse to the point where that is what is causing her to feel like she cant take a deep breath and her chest hurts.  Pt ambulatory to triage and in NAD with even and unlabored respirations.

## 2022-01-16 NOTE — ED Notes (Signed)
No answer when called several times from lobby 

## 2022-01-21 ENCOUNTER — Ambulatory Visit: Payer: Medicaid Other | Admitting: Surgery

## 2022-02-03 DIAGNOSIS — K8010 Calculus of gallbladder with chronic cholecystitis without obstruction: Secondary | ICD-10-CM | POA: Insufficient documentation

## 2022-02-15 ENCOUNTER — Encounter: Payer: Self-pay | Admitting: Emergency Medicine

## 2022-02-15 ENCOUNTER — Ambulatory Visit
Admission: EM | Admit: 2022-02-15 | Discharge: 2022-02-15 | Disposition: A | Discharge status: Home / Self Care | Payer: Medicaid Other

## 2022-02-15 ENCOUNTER — Other Ambulatory Visit: Payer: Self-pay

## 2022-02-15 DIAGNOSIS — J0100 Acute maxillary sinusitis, unspecified: Secondary | ICD-10-CM | POA: Diagnosis not present

## 2022-02-15 MED ORDER — AMOXICILLIN-POT CLAVULANATE 875-125 MG PO TABS: 1.0000 | ORAL_TABLET | Freq: Two times a day (BID) | ORAL | 0 refills | Status: DC

## 2022-02-15 NOTE — ED Triage Notes (Signed)
Patient c/o sinus congestion and pressure and ear pressure that started 3 weeks ago.  Patient denies fevers.

## 2022-02-15 NOTE — ED Provider Notes (Signed)
[facility]    CSN: [id number] Arrival date & time: 02/15/22  3875      History   Chief Complaint Chief Complaint  Patient presents with   Otalgia   Nasal Congestion    HPI [name] is a 36 y.o. female.   Patient presents with nasal congestion, bilateral ear pain and fullness, sinus pain and pressure and a nonproductive cough for 3 weeks.  No known sick contacts.  Tolerating food and liquids.  Has attempted use of Sudafed and a decongestant which was not helpful.  Recently had a cholecystectomy within the last month.  History reviewed. No pertinent past medical history.  There are no problems to display for this patient.   Past Surgical History:  Procedure Laterality Date   CESAREAN SECTION     DIAGNOSTIC LAPAROSCOPY WITH REMOVAL OF ECTOPIC PREGNANCY Left 07/09/2021   Procedure: DIAGNOSTIC LAPAROSCOPY WITH REMOVAL OF ECTOPIC PREGNANCY; LEFT SALPINGECTOMY;  Surgeon: Schermerhorn, Ihor Austin, MD;  Location: [facility] OR[facility];  Service: Gynecology;  Laterality: Left;   LAPAROSCOPIC OVARIAN CYSTECTOMY Right 07/09/2021   Procedure: LAPAROSCOPIC OVARIAN CYSTECTOMY;  Surgeon: Schermerhorn, Ihor Austin, MD;  Location: [facility];  Service: Gynecology;  Laterality: Right;    OB History     Gravida  1   Para      Term      Preterm      AB      Living         SAB      IAB      Ectopic      Multiple      Live Births               Home Medications    Prior to Admission medications   Medication Sig Start Date End Date Taking? Authorizing Provider  baclofen (LIORESAL) 10 MG tablet Take 1 tablet (10 mg total) by mouth 3 (three) times daily. 01/06/22   Becky Augusta, NP  benzonatate (TESSALON) 100 MG capsule Take 2 capsules (200 mg total) by mouth every 8 (eight) hours. 12/26/21   Becky Augusta, NP  cefixime (SUPRAX) 400 MG CAPS capsule Take 400 mg by mouth once. 01/04/22   [provider]  Cholecalciferol (VITAMIN D) 125 MCG (5000 UT) CAPS Take 1  capsule by mouth daily.    [provider]  gabapentin (NEURONTIN) 300 MG capsule Take 300 mg by mouth every 8 (eight) hours. 02/04/22   [provider]  ibuprofen (ADVIL) 600 MG tablet Take 1 tablet (600 mg total) by mouth every 6 (six) hours as needed. 01/06/22   Becky Augusta, NP  ipratropium (ATROVENT) 0.06 % nasal spray Place 2 sprays into both nostrils 4 (four) times daily. 12/26/21   Becky Augusta, NP  Multiple Vitamin (MULTIVITAMIN) tablet Take 1 tablet by mouth daily.    [provider]  ondansetron (ZOFRAN) 4 MG tablet Take 1 tablet (4 mg total) by mouth daily as needed for nausea or vomiting. 01/11/22 01/11/23  Georga Hacking, MD  promethazine-phenylephrine (PROMETHAZINE VC) 6.25-5 MG/5ML SYRP Take 5 mLs by mouth every 6 (six) hours as needed for congestion. 12/26/21   Becky Augusta, NP    Family History History reviewed. No pertinent family history.  Social History Social History   Tobacco Use   Smoking status: Every Day    Types: E-cigarettes   Smokeless tobacco: Never  Vaping Use   Vaping Use: Never used  Substance Use Topics   Alcohol use: Never   Drug use:  Never     Allergies   Patient has no known allergies.   Review of Systems Review of Systems  Constitutional: Negative.   HENT:  Positive for congestion, ear pain, sinus pressure and sinus pain. Negative for dental problem, drooling, ear discharge, facial swelling, hearing loss, mouth sores, nosebleeds, postnasal drip, rhinorrhea, sneezing, sore throat, tinnitus, trouble swallowing and voice change.   Respiratory:  Positive for cough. Negative for apnea, choking, chest tightness, shortness of breath, wheezing and stridor.   Cardiovascular: Negative.   Skin: Negative.     Physical Exam Triage Vital Signs ED Triage Vitals  Enc Vitals Group     BP 02/15/22 0929 111/69     Pulse Rate 02/15/22 0929 (!) 58     Resp 02/15/22 0929 14     Temp 02/15/22 0929 98.2 F (36.8 C)     Temp  Source 02/15/22 0929 Oral     SpO2 02/15/22 0929 100 %     Weight 02/15/22 0927 140 lb (63.5 kg)     Height 02/15/22 0927 5\' 1"  (1.549 m)     Head Circumference --      Peak Flow --      Pain Score 02/15/22 0926 6     Pain Loc --      Pain Edu? --      Excl. in GC? --    No data found.  Updated Vital Signs BP 111/69 (BP Location: Left Arm)    Pulse (!) 58    Temp 98.2 F (36.8 C) (Oral)    Resp 14    Ht 5\' 1"  (1.549 m)    Wt 140 lb (63.5 kg)    LMP 01/31/2022 (Exact Date)    SpO2 100%    BMI 26.45 kg/m   Visual Acuity Right Eye Distance:   Left Eye Distance:   Bilateral Distance:    Right Eye Near:   Left Eye Near:    Bilateral Near:     Physical Exam Constitutional:      Appearance: Normal appearance.  HENT:     Head: Normocephalic.     Right Ear: Tympanic membrane, ear canal and external ear normal.     Left Ear: Tympanic membrane, ear canal and external ear normal.     Nose: Congestion and rhinorrhea present.     Mouth/Throat:     Mouth: Mucous membranes are moist.     Pharynx: Oropharynx is clear.  Eyes:     Extraocular Movements: Extraocular movements intact.  Cardiovascular:     Rate and Rhythm: Normal rate and regular rhythm.     Pulses: Normal pulses.     Heart sounds: Normal heart sounds.  Pulmonary:     Effort: Pulmonary effort is normal.     Breath sounds: Normal breath sounds.  Musculoskeletal:     Cervical back: Normal range of motion.  Skin:    General: Skin is warm and dry.  Neurological:     Mental Status: She is alert and oriented to person, place, and time. Mental status is at baseline.  Psychiatric:        Mood and Affect: Mood normal.        Behavior: Behavior normal.     UC Treatments / Results  Labs (all labs ordered are listed, but only abnormal results are displayed) Labs Reviewed - No data to display  EKG   Radiology No results found.  Procedures Procedures (including critical care time)  Medications Ordered in  UC Medications -  No data to display  Initial Impression / Assessment and Plan / UC Course  I have reviewed the triage vital signs and the nursing notes.  Pertinent labs & imaging results that were available during my care of the patient were reviewed by me and considered in my medical decision making (see chart for details).  Acute nonrecurrent maxillary sinusitis  Prescribed Augmentin as symptoms have been progressing for 3 weeks, recommend over-the-counter medications such as Mucinex, antihistamines and nasal sprays for further management of symptoms, may also attempt saline irrigation for comfort, may follow-up with urgent care as needed Final Clinical Impressions(s) / UC Diagnoses   Final diagnoses:  None   Discharge Instructions   None    ED Prescriptions   None    PDMP not reviewed this encounter.   Valinda Hoar, NP 02/15/22 1019

## 2022-02-15 NOTE — Discharge Instructions (Signed)
The Augmentin  twice daily with food for 10 days for treatment of your sinusitis.  Perform sinus irrigation 2-3 times a day with a [employer]Med sinus rinse kit and distilled water.  Do not use tap water.  You can use plain over-the-counter Mucinex every 6 hours to break up the stickiness of the mucus so your body can clear it.  Increase your oral fluid intake to thin out your mucus so that is also able for your body to clear more easily.  Use the Atrovent nasal spray, 2 squirts up each nostril every 6 hours, as needed for nasal and sinus congestion.  If you develop any new or worsening symptoms return for reevaluation or see your primary care provider.

## 2022-09-08 ENCOUNTER — Emergency Department
Admission: EM | Admit: 2022-09-08 | Discharge: 2022-09-08 | Disposition: A | Discharge status: Home / Self Care | Payer: Medicaid Other | Attending: Emergency Medicine | Admitting: Emergency Medicine

## 2022-09-08 DIAGNOSIS — B9689 Other specified bacterial agents as the cause of diseases classified elsewhere: Secondary | ICD-10-CM | POA: Insufficient documentation

## 2022-09-08 DIAGNOSIS — Z20822 Contact with and (suspected) exposure to covid-19: Secondary | ICD-10-CM | POA: Diagnosis not present

## 2022-09-08 DIAGNOSIS — R5383 Other fatigue: Secondary | ICD-10-CM | POA: Insufficient documentation

## 2022-09-08 DIAGNOSIS — N760 Acute vaginitis: Secondary | ICD-10-CM | POA: Insufficient documentation

## 2022-09-08 LAB — COMPREHENSIVE METABOLIC PANEL
ALT: 17 U/L (ref 0–44)
AST: 22 U/L (ref 15–41)
Albumin: 4.6 g/dL (ref 3.5–5.0)
Alkaline Phosphatase: 54 U/L (ref 38–126)
Anion gap: 9 (ref 5–15)
BUN: 17 mg/dL (ref 6–20)
CO2: 23 mmol/L (ref 22–32)
Calcium: 9.9 mg/dL (ref 8.9–10.3)
Chloride: 105 mmol/L (ref 98–111)
Creatinine, Ser: 0.79 mg/dL (ref 0.44–1.00)
GFR, Estimated: >60 mL/min (ref >60)
Glucose, Bld: 103 mg/dL — ABNORMAL HIGH (ref 70–99)
Potassium: 3.8 mmol/L (ref 3.5–5.1)
Sodium: 137 mmol/L (ref 135–145)
Total Bilirubin: 1.1 mg/dL (ref 0.3–1.2)
Total Protein: 7.9 g/dL (ref 6.5–8.1)

## 2022-09-08 LAB — CBC
HCT: 39.7 % (ref 36.0–46.0)
Hemoglobin: 13.3 g/dL (ref 12.0–15.0)
MCH: 31.4 pg (ref 26.0–34.0)
MCHC: 33.5 g/dL (ref 30.0–36.0)
MCV: 93.6 fL (ref 80.0–100.0)
Platelets: 277 10*3/uL (ref 150–400)
RBC: 4.24 MIL/uL (ref 3.87–5.11)
RDW: 13.2 % (ref 11.5–15.5)
WBC: 9.8 10*3/uL (ref 4.0–10.5)
nRBC: 0 % (ref 0.0–0.2)

## 2022-09-08 LAB — URINALYSIS, ROUTINE W REFLEX MICROSCOPIC
Bilirubin Urine: NEGATIVE
Glucose, UA: NEGATIVE mg/dL
Hgb urine dipstick: NEGATIVE
Ketones, ur: NEGATIVE mg/dL
Leukocytes,Ua: NEGATIVE
Nitrite: NEGATIVE
Protein, ur: NEGATIVE mg/dL
Specific Gravity, Urine: 1.024 (ref 1.005–1.030)
pH: 5 (ref 5.0–8.0)

## 2022-09-08 LAB — LIPASE, BLOOD: Lipase: 26 U/L (ref 11–51)

## 2022-09-08 LAB — CHLAMYDIA/NGC RT PCR (ARMC ONLY)
Chlamydia Tr: NOT DETECTED
N gonorrhoeae: NOT DETECTED

## 2022-09-08 LAB — RESP PANEL BY RT-PCR (FLU A&B, COVID) ARPGX2
Influenza A by PCR: NEGATIVE
Influenza B by PCR: NEGATIVE
SARS Coronavirus 2 by RT PCR: NEGATIVE

## 2022-09-08 LAB — WET PREP, GENITAL
Sperm: NONE SEEN
Trich, Wet Prep: NONE SEEN
WBC, Wet Prep HPF POC: >=10 — AB (ref <10)
Yeast Wet Prep HPF POC: NONE SEEN

## 2022-09-08 LAB — CK: Total CK: 96 U/L (ref 38–234)

## 2022-09-08 LAB — MONONUCLEOSIS SCREEN: Mono Screen: NEGATIVE

## 2022-09-08 LAB — POC URINE PREG, ED: Preg Test, Ur: NEGATIVE

## 2022-09-08 MED ORDER — METRONIDAZOLE 0.75 % VA GEL: 1.0000 | Freq: Every day | VAGINAL | 0 refills | Status: AC

## 2022-09-08 NOTE — Discharge Instructions (Signed)
Your test results show that you have bacterial vaginosis.  You may use the MetroGel as prescribed.  Please return for any new, worsening, or change in symptoms or other concerns.  It was a pleasure caring for you today.

## 2022-09-08 NOTE — ED Triage Notes (Signed)
Pt presents to ED from home C/O intermittent upper abdominal pain, fatigue, lightheaded, irregular vaginal bleeding X 1 month.

## 2022-09-08 NOTE — ED Notes (Signed)
Pt Dc to home. Dc instructions reviewed with all questions answered. Pt verbalizes understanding. IV removed, cath intact, pressure dressing applied. No bleeding noted at site. Pt ambulatory out of dept with steady gait 

## 2022-09-08 NOTE — ED Provider Notes (Signed)
[facility] Provider Note    Event Date/Time   First MD Initiated Contact with Patient 09/08/22 1806     (approximate)   History   Abdominal Pain and Fatigue   HPI  [name] is a 36 y.o. female who presents today with feeling fatigued for the past couple of weeks.  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  Patient reports that she and her husband were recently separated but have gotten back together.  She would like to be tested for STDs and HIV.  She denies having any vaginal discharge or abdominal pain.  She denies any burning with urination.  She has not had any cough.  She does report that she had a stuffy nose over the last couple of days.  He denies chest pain or shortness of breath.  She also reports that her period started earlier than she expected by 3 days.  She reports that she is not bleeding more than normal.  There are no problems to display for this patient.         Physical Exam   Triage Vital Signs: ED Triage Vitals  Enc Vitals Group     BP 09/08/22 1737 118/79     Pulse Rate 09/08/22 1737 71     Resp 09/08/22 1737 16     Temp 09/08/22 1737 98.4 F (36.9 C)     Temp Source 09/08/22 1737 Oral     SpO2 09/08/22 1737 96 %     Weight 09/08/22 1738 138 lb (62.6 kg)     Height 09/08/22 1738 5\' 1"  (1.549 m)     Head Circumference --      Peak Flow --      Pain Score 09/08/22 1738 0     Pain Loc --      Pain Edu? --      Excl. in GC? --     Most recent vital signs: Vitals:   09/08/22 1737 09/08/22 2155  BP: 118/79 112/61  Pulse: 71 72  Resp: 16 18  Temp: 98.4 F (36.9 C) 98 F (36.7 C)  SpO2: 96% 100%    Physical Exam Vitals and nursing note reviewed.  Constitutional:      General: Awake and alert. No acute distress.    Appearance: Normal appearance. The patient is overweight.  HENT:     Head: Normocephalic and atraumatic.     Mouth: Mucous  membranes are moist.  Mild nasal congestion present Eyes:     General: PERRL. Normal EOMs        Right eye: No discharge.        Left eye: No discharge.     Conjunctiva/sclera: Conjunctivae normal.  Cardiovascular:     Rate and Rhythm: Normal rate and regular rhythm.     Pulses: Normal pulses.     Heart sounds: Normal heart sounds Pulmonary:     Effort: Pulmonary effort is normal. No respiratory distress.     Breath sounds: Normal breath sounds.  Abdominal:     Abdomen is soft. There is no abdominal tenderness. No rebound or guarding. No distention. Declined pelvic exam.  Musculoskeletal:        General: No swelling. Normal range of motion.     Cervical back: Normal range of motion and neck supple.  Skin:    General: Skin is warm and dry.     Capillary Refill: Capillary refill takes less  than 2 seconds.     Findings: No rash.  Neurological:     Mental Status: The patient is awake and alert.      ED Results / Procedures / Treatments   Labs (all labs ordered are listed, but only abnormal results are displayed) Labs Reviewed  WET PREP, GENITAL - Abnormal; Notable for the following components:      Result Value   Clue Cells Wet Prep HPF POC PRESENT (*)    WBC, Wet Prep HPF POC >=10 (*)    All other components within normal limits  COMPREHENSIVE METABOLIC PANEL - Abnormal; Notable for the following components:   Glucose, Bld 103 (*)    All other components within normal limits  URINALYSIS, ROUTINE W REFLEX MICROSCOPIC - Abnormal; Notable for the following components:   Color, Urine YELLOW (*)    APPearance HAZY (*)    All other components within normal limits  RESP PANEL BY RT-PCR (FLU A&B, COVID) ARPGX2  CHLAMYDIA/NGC RT PCR ([facility] ONLY)            LIPASE, BLOOD  CBC  CK  MONONUCLEOSIS SCREEN  RPR  HIV ANTIBODY (ROUTINE TESTING W REFLEX)  POC URINE PREG, ED     EKG     RADIOLOGY     PROCEDURES:  Critical Care performed:   Procedures   MEDICATIONS  ORDERED IN ED: Medications - No data to display   IMPRESSION / MDM / ASSESSMENT AND PLAN / ED COURSE  I reviewed the triage vital signs and the nursing notes.   Differential diagnosis includes, but is not limited to, viral syndrome, pregnancy, rhabdomyolysis, urinary tract infection, COVID, influenza, mononucleosis.  Patient is awake and alert, hemodynamically stable and afebrile.  She is nontoxic in appearance.  Labs obtained and are overall reassuring.  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  No electrolyte disarray, normal LFTs.  COVID swab is negative.  Mononucleosis is negative.  HIV and RPR obtained because patient reported that she and her husband were separated and she wants to "be sure."  Urinalysis does not demonstrate any evidence of infection, though self swab demonstrates clue cells indicative of BV.  Patient declined formal pelvic exam.  She was given the option of Flagyl versus intravaginal metronidazole and prefers the latter.  This was prescribed.  She was advised that the HIV and RPR tests will not result until the next 1 to 2 days and she was advised that she can find these on MyChart.  She does endorse some nasal congestion, she might also have a URI.  She was reassured by her work-up today.  We discussed return precautions and the importance of close outpatient follow-up.  Patient understands and agrees with plan.  She was discharged in stable condition.   Patient's presentation is most consistent with acute complicated illness / injury requiring diagnostic workup.      FINAL CLINICAL IMPRESSION(S) / ED DIAGNOSES   Final diagnoses:  Other fatigue  Bacterial vaginosis     Rx / DC Orders   ED Discharge Orders          Ordered    metroNIDAZOLE (METROGEL) 0.75 % vaginal gel  Daily at bedtime        09/08/22 2142             Note:  This document was prepared using Dragon voice recognition software and  may include unintentional dictation errors.   Madora Barletta, Herb Grays,  PA-C 09/08/22 2211    Chesley Noon, MD 09/08/22 2237

## 2022-09-09 LAB — RPR: RPR Ser Ql: NONREACTIVE

## 2022-09-09 LAB — HIV ANTIBODY (ROUTINE TESTING W REFLEX): HIV Screen 4th Generation wRfx: NONREACTIVE

## 2023-01-10 ENCOUNTER — Emergency Department: Payer: Medicaid Other

## 2023-01-10 ENCOUNTER — Other Ambulatory Visit: Payer: Self-pay

## 2023-01-10 ENCOUNTER — Emergency Department
Admission: EM | Admit: 2023-01-10 | Discharge: 2023-01-10 | Disposition: A | Discharge status: Home / Self Care | Payer: Medicaid Other | Attending: Emergency Medicine | Admitting: Emergency Medicine

## 2023-01-10 DIAGNOSIS — R1084 Generalized abdominal pain: Secondary | ICD-10-CM | POA: Diagnosis present

## 2023-01-10 DIAGNOSIS — K529 Noninfective gastroenteritis and colitis, unspecified: Secondary | ICD-10-CM | POA: Insufficient documentation

## 2023-01-10 LAB — CBC
HCT: 40.3 % (ref 36.0–46.0)
Hemoglobin: 13.7 g/dL (ref 12.0–15.0)
MCH: 31.6 pg (ref 26.0–34.0)
MCHC: 34 g/dL (ref 30.0–36.0)
MCV: 92.9 fL (ref 80.0–100.0)
Platelets: 323 10*3/uL (ref 150–400)
RBC: 4.34 MIL/uL (ref 3.87–5.11)
RDW: 13.1 % (ref 11.5–15.5)
WBC: 16.2 10*3/uL — ABNORMAL HIGH (ref 4.0–10.5)
nRBC: 0 % (ref 0.0–0.2)

## 2023-01-10 LAB — COMPREHENSIVE METABOLIC PANEL
ALT: 60 U/L — ABNORMAL HIGH (ref 0–44)
AST: 25 U/L (ref 15–41)
Albumin: 4.5 g/dL (ref 3.5–5.0)
Alkaline Phosphatase: 60 U/L (ref 38–126)
Anion gap: 8 (ref 5–15)
BUN: 17 mg/dL (ref 6–20)
CO2: 25 mmol/L (ref 22–32)
Calcium: 9.4 mg/dL (ref 8.9–10.3)
Chloride: 101 mmol/L (ref 98–111)
Creatinine, Ser: 0.79 mg/dL (ref 0.44–1.00)
GFR, Estimated: >60 mL/min (ref >60)
Glucose, Bld: 116 mg/dL — ABNORMAL HIGH (ref 70–99)
Potassium: 3.3 mmol/L — ABNORMAL LOW (ref 3.5–5.1)
Sodium: 134 mmol/L — ABNORMAL LOW (ref 135–145)
Total Bilirubin: 0.7 mg/dL (ref 0.3–1.2)
Total Protein: 7.8 g/dL (ref 6.5–8.1)

## 2023-01-10 LAB — URINALYSIS, ROUTINE W REFLEX MICROSCOPIC
Bilirubin Urine: NEGATIVE
Glucose, UA: NEGATIVE mg/dL
Hgb urine dipstick: NEGATIVE
Ketones, ur: NEGATIVE mg/dL
Leukocytes,Ua: NEGATIVE
Nitrite: NEGATIVE
Protein, ur: NEGATIVE mg/dL
Specific Gravity, Urine: 1.01 (ref 1.005–1.030)
pH: 6 (ref 5.0–8.0)

## 2023-01-10 LAB — LIPASE, BLOOD: Lipase: 37 U/L (ref 11–51)

## 2023-01-10 LAB — POC URINE PREG, ED: Preg Test, Ur: NEGATIVE

## 2023-01-10 LAB — TROPONIN I (HIGH SENSITIVITY): Troponin I (High Sensitivity): 2 ng/L (ref <18)

## 2023-01-10 MED ORDER — PANTOPRAZOLE SODIUM 20 MG PO TBEC: 20.0000 mg | DELAYED_RELEASE_TABLET | Freq: Every day | ORAL | 0 refills | Status: DC

## 2023-01-10 MED ORDER — ONDANSETRON 4 MG PO TBDP: 4.0000 mg | ORAL_TABLET | Freq: Three times a day (TID) | ORAL | 0 refills | Status: DC | PRN

## 2023-01-10 MED ORDER — IOHEXOL 300 MG/ML  SOLN
100.0000 mL | Freq: Once | INTRAMUSCULAR | Status: AC | PRN
  Administered 2023-01-10: 100 mL via INTRAVENOUS

## 2023-01-10 MED ORDER — ONDANSETRON HCL 4 MG/2ML IJ SOLN
4.0000 mg | Freq: Once | INTRAMUSCULAR | Status: AC
  Administered 2023-01-10: 4 mg via INTRAVENOUS
  Filled 2023-01-10: qty 2

## 2023-01-10 NOTE — ED Provider Notes (Signed)
[facility] Provider Note    Event Date/Time   First MD Initiated Contact with Patient 01/10/23 [phone]     (approximate)   History   Abdominal Pain   HPI  [name] is a 37 y.o. female   presents to the ED with complaint of generalized abdominal pain for the last 4 days.  Patient states that she was seen at [facility] 3 days ago but left without being seen.  Patient reports nausea with vomiting.  She denies any fever or chills, no urinary symptoms, no diarrhea, no upper respiratory symptoms.  Patient is status post cholecystectomy February 2023.  Patient reports that she has not changed any of her eating habits and still continues to eat a lot of fried spicy foods.  She states that sometimes this increases her abdominal pain.  Patient denies use of alcohol but states that she does smoke e-cigarettes and occasional marijuana.      Physical Exam   Triage Vital Signs: ED Triage Vitals  Enc Vitals Group     BP 01/10/23 0947 (!) 153/87     Pulse Rate 01/10/23 0947 93     Resp 01/10/23 0947 18     Temp 01/10/23 0947 98.2 F (36.8 C)     Temp Source 01/10/23 0947 Oral     SpO2 01/10/23 0947 100 %     Weight 01/10/23 0948 149 lb (67.6 kg)     Height 01/10/23 0948 5\' 1"  (1.549 m)     Head Circumference --      Peak Flow --      Pain Score 01/10/23 0947 10     Pain Loc --      Pain Edu? --      Excl. in [place]? --     Most recent vital signs: Vitals:   01/10/23 0947  BP: (!) 153/87  Pulse: 93  Resp: 18  Temp: 98.2 F (36.8 C)  SpO2: 100%     General: Awake, no distress.  CV:  Good peripheral perfusion.  Regular rate and rhythm. Resp:  Normal effort.  Clear bilaterally. Abd:  No distention.  Soft, diffusely tender throughout.  No rebound or referred pain.  Bowel sounds normoactive x 4 quadrants.  No tenderness is noted on palpation of the pelvic area especially suprapubic or adnexal areas. Other:     ED Results / Procedures / Treatments    Labs (all labs ordered are listed, but only abnormal results are displayed) Labs Reviewed  COMPREHENSIVE METABOLIC PANEL - Abnormal; Notable for the following components:      Result Value   Sodium 134 (*)    Potassium 3.3 (*)    Glucose, Bld 116 (*)    ALT 60 (*)    All other components within normal limits  CBC - Abnormal; Notable for the following components:   WBC 16.2 (*)    All other components within normal limits  URINALYSIS, ROUTINE W REFLEX MICROSCOPIC - Abnormal; Notable for the following components:   Color, Urine STRAW (*)    APPearance CLEAR (*)    All other components within normal limits  LIPASE, BLOOD  POC URINE PREG, ED  TROPONIN I (HIGH SENSITIVITY)     EKG  Vent. rate 86 BPM PR interval 148 ms QRS duration 92 ms QT/QTcB 376/449 ms P-R-T axes 71 -13 71 Normal sinus rhythm with sinus arrhythmia Incomplete right bundle branch block Borderline ECG    RADIOLOGY CT scan abdomen and pelvis with contrast per  radiologist shows fluid in the small bowel which could represent enteritis of infectious or inflammatory nature.  Normal appendix.  Diverticulosis without evidence of acute diverticulitis.    PROCEDURES:  Critical Care performed:   Procedures   MEDICATIONS ORDERED IN ED: Medications  ondansetron (ZOFRAN) injection 4 mg (4 mg Intravenous Given 01/10/23 1057)  iohexol (OMNIPAQUE) 300 MG/ML solution 100 mL (100 mLs Intravenous Contrast Given 01/10/23 1117)     IMPRESSION / MDM / ASSESSMENT AND PLAN / ED COURSE  I reviewed the triage vital signs and the nursing notes.   Differential diagnosis includes, but is not limited to, generalized abdominal pain, gastroenteritis, viral illness, acute appendicitis, diverticulitis.  37 year old female presents to the ED with complaint of generalized abdominal pain for the last 4 days.  Patient was states that she was seen at [facility] but left prior to being seen.  She continues to have abdominal pain with  nausea and vomiting.  She is unaware of any fever and denies chills.  No urinary symptoms.  Physical exam showed generalized abdominal pain.  Urinalysis was negative, CMP unremarkable, pregnancy test negative, lipase 37, troponin 2, WBC 16.2.  CT abdomen and pelvis showed fluid in the small bowel but no other findings.  Radiology report suggest enteritis either inflammatory or viral.  Patient was placed on Protonix and Zofran.  We discussed for a long period of time her diet of lots of fried foods and spicy.  At this time she will go to a bland diet with the above medication.  She is to call and make an appointment with Dr. Haig Prophet who is on-call for gastroenterology and make an appointment for further evaluation.  Patient was improved prior to discharge and no continued vomiting.  She agrees with the above plan.      Patient's presentation is most consistent with acute complicated illness / injury requiring diagnostic workup.  FINAL CLINICAL IMPRESSION(S) / ED DIAGNOSES   Final diagnoses:  Enteritis     Rx / DC Orders   ED Discharge Orders          Ordered    pantoprazole (PROTONIX) 20 MG tablet  Daily        01/10/23 1156    ondansetron (ZOFRAN-ODT) 4 MG disintegrating tablet  Every 8 hours PRN        01/10/23 1156             Note:  This document was prepared using Dragon voice recognition software and may include unintentional dictation errors.   Johnn Hai, PA-C 01/10/23 1356    Nance Pear, MD 01/10/23 [phone]

## 2023-01-10 NOTE — ED Triage Notes (Signed)
Pt states abdominal pain for the last several days, with vomiting today, which pt states has blood clots in it. Pt states some dizziness. Pt states being seen at [facility], but left prior to having all the results.  Pt states she had her gallbladder removed several months ago. Pt states not changing her diet and still eating spicy and fatty foods. Pt states she had flu a few weeks ago as well.

## 2023-01-10 NOTE — Discharge Instructions (Signed)
Call make an appointment with Dr. Haig Prophet who is the gastroenterologist on-call today.  You need to be seen in the office in most likely more test to be done to evaluate your abdominal pain.  2 prescriptions were sent to the pharmacy 1 is for nausea only as needed the other is Protonix which is 1 daily.  Is also important for you to change the way you are eating currently.  Until your stomach has settled down you will need to eat bland foods, no fried foods, no spicy foods, crackers, toast, rice, applesauce, boiled or boiled vegetables and baked chicken or beef.  You may also have baked fish as long as you are not using spices on it.

## 2023-05-12 DIAGNOSIS — Z630 Problems in relationship with spouse or partner: Secondary | ICD-10-CM | POA: Insufficient documentation

## 2023-05-12 DIAGNOSIS — R87619 Unspecified abnormal cytological findings in specimens from cervix uteri: Secondary | ICD-10-CM | POA: Insufficient documentation

## 2023-05-14 IMAGING — CR DG CHEST 2V
1 series · 2 of 2 positions shown · non-contrast
Comparison: None.

CLINICAL DATA: Chest pain.

EXAM:
CHEST - 2 VIEW

[Series 1: dg chest 2 view · 0.14mm/px · 2 of 2 slices shown]
[im 1/2]
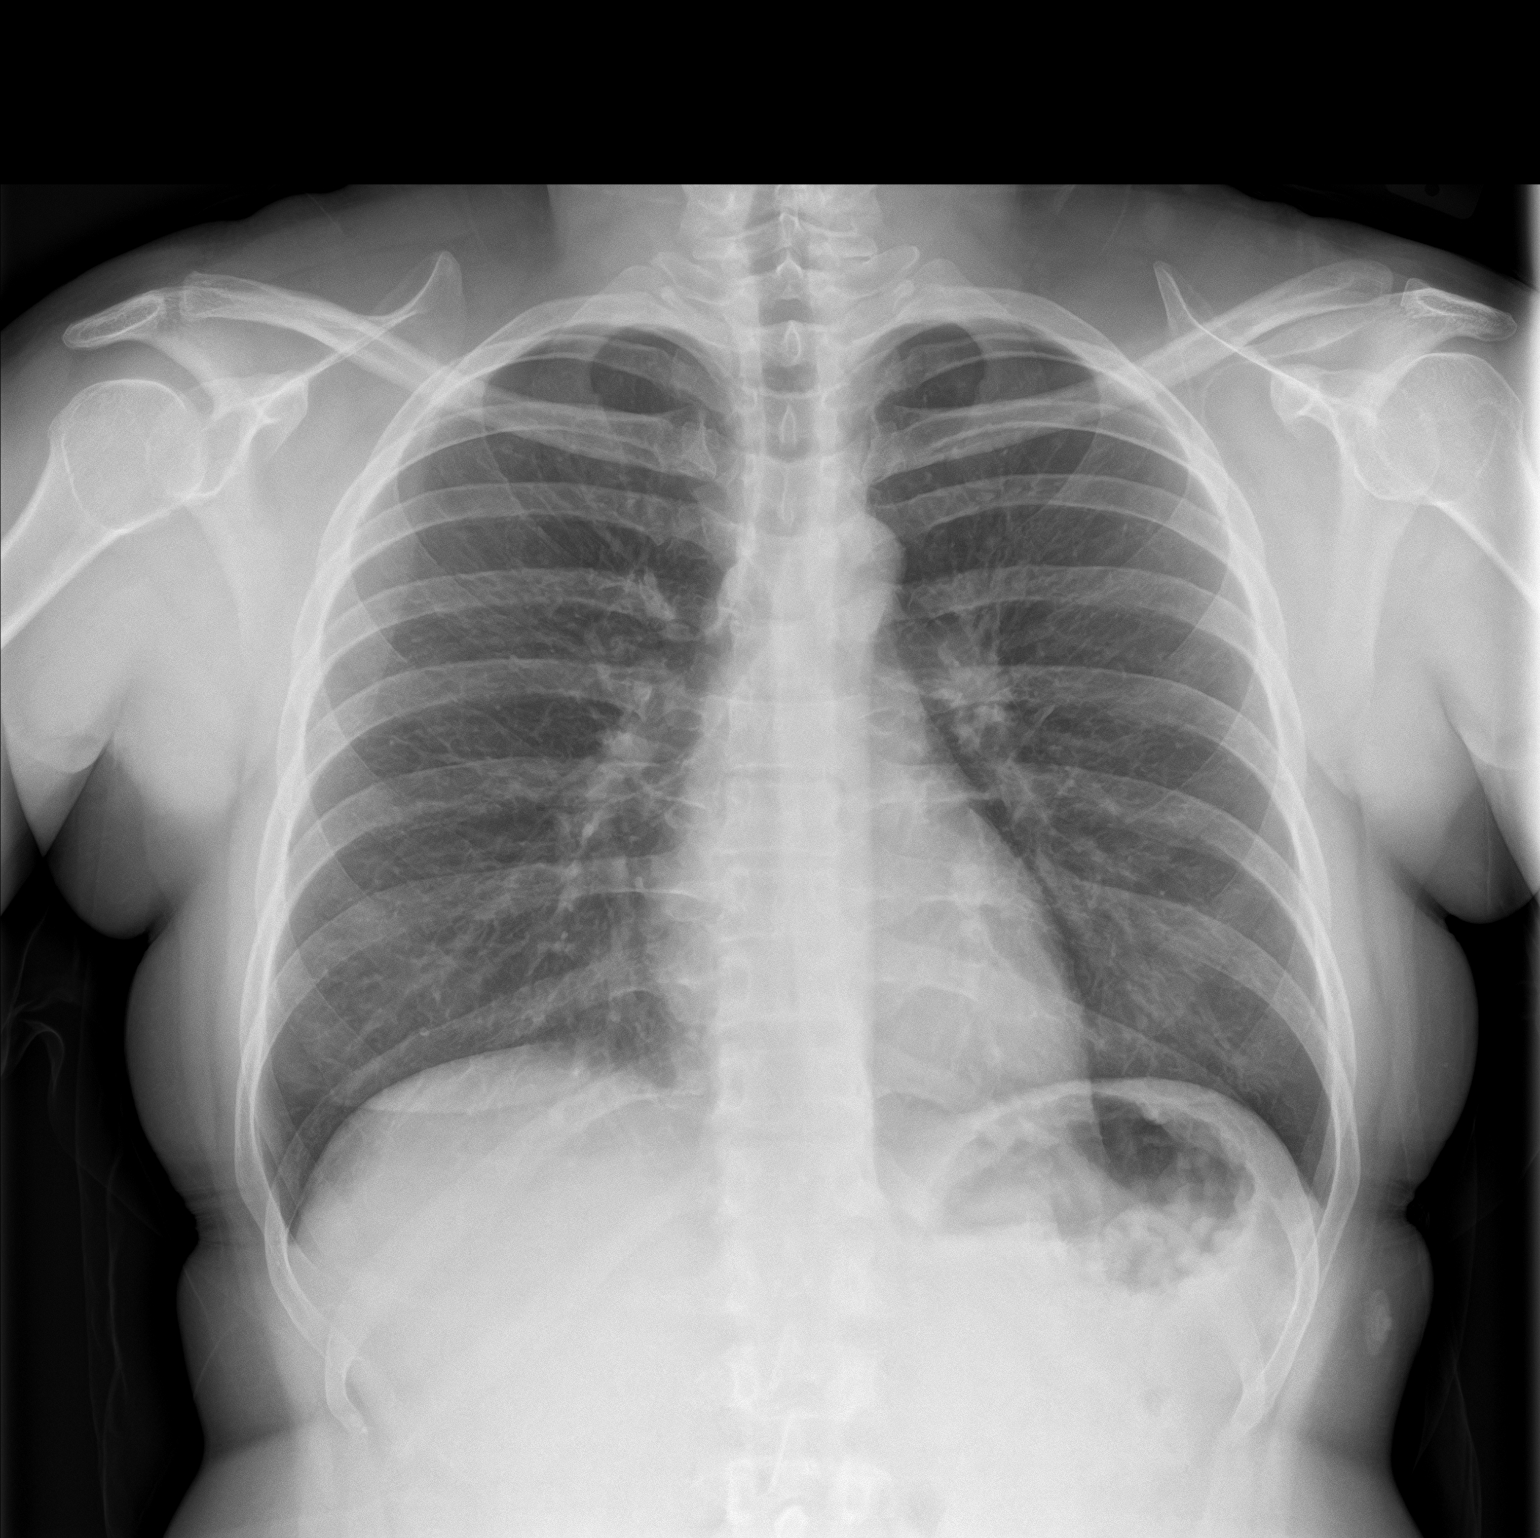
[im 2/2]
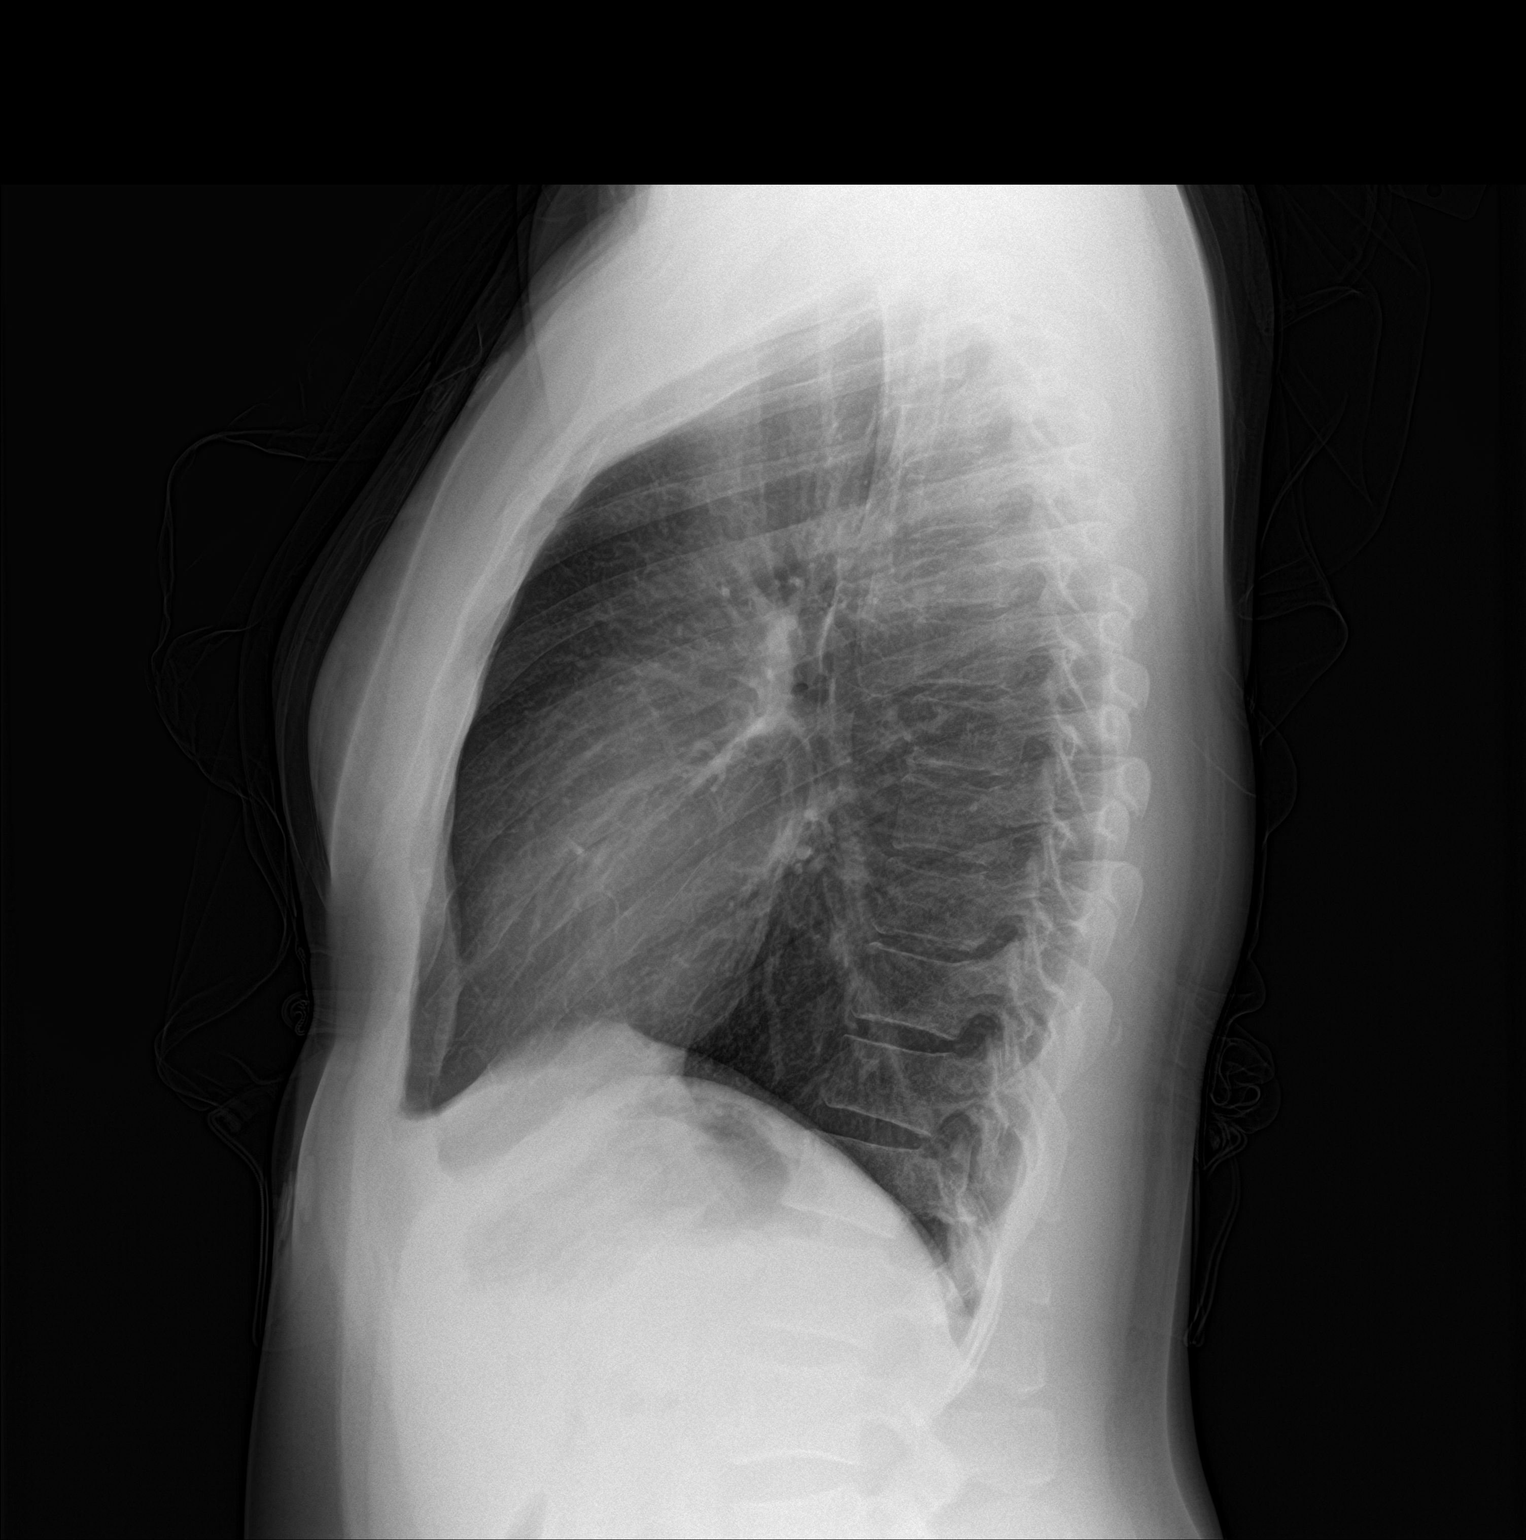

[2 of 2 positions shown; findings below may reference images not displayed]

FINDINGS: The heart size and mediastinal contours are within normal limits.
Both lungs are clear. The visualized skeletal structures are
unremarkable. Negative for a pneumothorax.
IMPRESSION: No active cardiopulmonary disease.

## 2023-05-19 ENCOUNTER — Ambulatory Visit: Payer: Self-pay

## 2023-05-19 DIAGNOSIS — K2950 Unspecified chronic gastritis without bleeding: Secondary | ICD-10-CM | POA: Diagnosis not present

## 2023-05-19 DIAGNOSIS — K2289 Other specified disease of esophagus: Secondary | ICD-10-CM

## 2023-05-24 ENCOUNTER — Emergency Department: Payer: Medicaid Other

## 2023-05-24 ENCOUNTER — Emergency Department
Admission: EM | Admit: 2023-05-24 | Discharge: 2023-05-24 | Disposition: A | Discharge status: Home / Self Care | Payer: Medicaid Other | Attending: Emergency Medicine | Admitting: Emergency Medicine

## 2023-05-24 ENCOUNTER — Other Ambulatory Visit: Payer: Self-pay

## 2023-05-24 DIAGNOSIS — O26891 Other specified pregnancy related conditions, first trimester: Secondary | ICD-10-CM | POA: Diagnosis not present

## 2023-05-24 DIAGNOSIS — R102 Pelvic and perineal pain: Secondary | ICD-10-CM | POA: Diagnosis not present

## 2023-05-24 DIAGNOSIS — Z6740 Type O blood, Rh positive: Secondary | ICD-10-CM | POA: Diagnosis not present

## 2023-05-24 DIAGNOSIS — Z3A01 Less than 8 weeks gestation of pregnancy: Secondary | ICD-10-CM | POA: Insufficient documentation

## 2023-05-24 DIAGNOSIS — R1030 Lower abdominal pain, unspecified: Secondary | ICD-10-CM | POA: Insufficient documentation

## 2023-05-24 DIAGNOSIS — R42 Dizziness and giddiness: Secondary | ICD-10-CM | POA: Insufficient documentation

## 2023-05-24 LAB — URINALYSIS, W/ REFLEX TO CULTURE (INFECTION SUSPECTED)
Bacteria, UA: NONE SEEN
Bilirubin Urine: NEGATIVE
Glucose, UA: NEGATIVE mg/dL
Hgb urine dipstick: NEGATIVE
Ketones, ur: NEGATIVE mg/dL
Leukocytes,Ua: NEGATIVE
Nitrite: NEGATIVE
Protein, ur: NEGATIVE mg/dL
Specific Gravity, Urine: 1.012 (ref 1.005–1.030)
pH: 9 — ABNORMAL HIGH (ref 5.0–8.0)

## 2023-05-24 LAB — BASIC METABOLIC PANEL
Anion gap: 5 (ref 5–15)
BUN: 10 mg/dL (ref 6–20)
CO2: 22 mmol/L (ref 22–32)
Calcium: 8.8 mg/dL — ABNORMAL LOW (ref 8.9–10.3)
Chloride: 108 mmol/L (ref 98–111)
Creatinine, Ser: 0.71 mg/dL (ref 0.44–1.00)
GFR, Estimated: >60 mL/min (ref >60)
Glucose, Bld: 108 mg/dL — ABNORMAL HIGH (ref 70–99)
Potassium: 4 mmol/L (ref 3.5–5.1)
Sodium: 135 mmol/L (ref 135–145)

## 2023-05-24 LAB — CHLAMYDIA/NGC RT PCR (ARMC ONLY)
Chlamydia Tr: NOT DETECTED
N gonorrhoeae: NOT DETECTED

## 2023-05-24 LAB — CBC
HCT: 39.8 % (ref 36.0–46.0)
Hemoglobin: 13.2 g/dL (ref 12.0–15.0)
MCH: 31.7 pg (ref 26.0–34.0)
MCHC: 33.2 g/dL (ref 30.0–36.0)
MCV: 95.7 fL (ref 80.0–100.0)
Platelets: 267 10*3/uL (ref 150–400)
RBC: 4.16 MIL/uL (ref 3.87–5.11)
RDW: 13.2 % (ref 11.5–15.5)
WBC: 6.9 10*3/uL (ref 4.0–10.5)
nRBC: 0 % (ref 0.0–0.2)

## 2023-05-24 LAB — WET PREP, GENITAL
Clue Cells Wet Prep HPF POC: NONE SEEN
Sperm: NONE SEEN
Trich, Wet Prep: NONE SEEN
WBC, Wet Prep HPF POC: <10 (ref <10)
Yeast Wet Prep HPF POC: NONE SEEN

## 2023-05-24 LAB — HCG, QUANTITATIVE, PREGNANCY: hCG, Beta Chain, Quant, S: 13 m[IU]/mL — ABNORMAL HIGH (ref <5)

## 2023-05-24 LAB — POC URINE PREG, ED: Preg Test, Ur: NEGATIVE

## 2023-05-24 LAB — HIV ANTIBODY (ROUTINE TESTING W REFLEX): HIV Screen 4th Generation wRfx: NONREACTIVE

## 2023-05-24 LAB — RPR: RPR Ser Ql: NONREACTIVE

## 2023-05-24 MED ORDER — SODIUM CHLORIDE 0.9 % IV BOLUS
1000.0000 mL | Freq: Once | INTRAVENOUS | Status: AC
  Administered 2023-05-24: 1000 mL via INTRAVENOUS

## 2023-05-24 NOTE — ED Triage Notes (Signed)
Pt to ED for dizziness, lower pelvic pain for 1 week. Reports positive pregnancy test yesterday. States hx ectopic pregnancy.

## 2023-05-24 NOTE — ED Provider Notes (Signed)
[facility] Provider Note    Event Date/Time   First MD Initiated Contact with Patient 05/24/23 [phone]     (approximate)   History   Dizziness   HPI  [name] is a 37 y.o. female presenting to the emergency department for evaluation of lightheadedness and lower abdominal pain.  Patient reports that for the last week she has had dizziness described as lightheadedness without syncope.  She has also had some lower abdominal pain with some "cottage cheese" type discharge without associated burning or itching, has noted some pink tinge.  She did take a pregnancy test yesterday that returned positive, but reports that she took 1 today that was negative.  She does have a history of ectopic pregnancy and reports that her lightheadedness and pelvic pain feels similar to when this occurred previously.  LMP 5/10.  Denies recent vaginal bleeding.  No numbness, tingling, focal weakness.  Has been having upper abdominal pain for which she is following up as an outpatient with GI and does report that she has had some decreased p.o. intake and fatigue related to this.  Lab work from 2022 with O+ blood type.     Physical Exam   Triage Vital Signs: ED Triage Vitals  Enc Vitals Group     BP 05/24/23 0715 113/74     Pulse Rate 05/24/23 0715 67     Resp 05/24/23 0715 16     Temp 05/24/23 0723 98.2 F (36.8 C)     Temp src --      SpO2 05/24/23 0715 97 %     Weight 05/24/23 0714 145 lb (65.8 kg)     Height 05/24/23 0714 5\' 1"  (1.549 m)     Head Circumference --      Peak Flow --      Pain Score 05/24/23 0713 3     Pain Loc --      Pain Edu? --      Excl. in GC? --     Most recent vital signs: Vitals:   05/24/23 0723 05/24/23 0730  BP:  102/72  Pulse:  66  Resp:  16  Temp: 98.2 F (36.8 C)   SpO2:  97%     General: Awake, interactive  CV:  Regular rate, good peripheral perfusion.  Resp:  Lungs clear, unlabored respirations.  Abd:  Soft, nondistended,  mild tenderness over the lower abdomen GU:   External genitalia unremarkable.  Speculum exam with moderate amount of thick white discharge, rare amount of pink discharge, no active bleeding.  Cervix closed in appearance without any protruding material. Bimanual exam without cervical motion tenderness, adnexal tenderness or any masses appreciated.  Neuro:  Symmetric facial movement, fluid speech   ED Results / Procedures / Treatments   Labs (all labs ordered are listed, but only abnormal results are displayed) Labs Reviewed  BASIC METABOLIC PANEL - Abnormal; Notable for the following components:      Result Value   Glucose, Bld 108 (*)    Calcium 8.8 (*)    All other components within normal limits  HCG, QUANTITATIVE, PREGNANCY - Abnormal; Notable for the following components:   hCG, Beta Chain, Quant, S 13 (*)    All other components within normal limits  URINALYSIS, W/ REFLEX TO CULTURE (INFECTION SUSPECTED) - Abnormal; Notable for the following components:   Color, Urine YELLOW (*)    APPearance HAZY (*)    pH 9.0 (*)    All other components within  normal limits  WET PREP, GENITAL  CHLAMYDIA/NGC RT PCR ([facility] ONLY)            CBC  HIV ANTIBODY (ROUTINE TESTING W REFLEX)  RPR  POC URINE PREG, ED     EKG EKG independently reviewed interpreted by myself (ER attending) demonstrates:    RADIOLOGY Imaging independently reviewed and interpreted by myself demonstrates:  OB ultrasound without visible IUP  PROCEDURES:  Critical Care performed: No  Procedures   MEDICATIONS ORDERED IN ED: Medications  sodium chloride 0.9 % bolus 1,000 mL (0 mLs Intravenous Stopped 05/24/23 1004)     IMPRESSION / MDM / ASSESSMENT AND PLAN / ED COURSE  I reviewed the triage vital signs and the nursing notes.  Differential diagnosis includes, but is not limited to, ruptured ectopic pregnancy, early normal IUP, false positive pregnancy test, dehydration, electrolyte abnormality, anemia, BV,  yeast infection,trichomoniasis,  other STD  Patient's presentation is most consistent with acute presentation with potential threat to life or bodily function.  37 year old female presenting with lightheadedness and lower abdominal pain with positive pregnancy test at home.  Will obtain labs, pregnancy test here, ultrasound.  Will treat with IV fluids.  UPT negative with extremely low beta-hCG at 13.  This could be reflective of very early pregnancy with an LMP earlier this month.  CBC and BMP reassuring.  UA without evidence of infection.  Wet prep without evidence of BV, trichomoniasis, yeast.  GC test negative. OB ultrasound without IUP noted, possibly due to very early or failed pregnancy.  Patient was updated on the results of her workup.  She reports feeling improved.  I did offer to contact our OB/GYN group to arrange for follow-up, but she reports that she has received all of her prior pregnancy care through her primary care office at [facility] and she wishes to arrange follow-up with them herself.  I did discuss that her ultrasound does not rule out the possibility of an ectopic pregnancy and is very important that she contact her office for close outpatient follow-up.  She expressed understanding.  Strict return precautions were provided.  Patient was discharged in stable condition.      FINAL CLINICAL IMPRESSION(S) / ED DIAGNOSES   Final diagnoses:  Lightheadedness  Pelvic pain affecting pregnancy in first trimester, antepartum     Rx / DC Orders   ED Discharge Orders     None        Note:  This document was prepared using Dragon voice recognition software and may include unintentional dictation errors.   Trinna Post, MD 05/24/23 [phone]

## 2023-05-24 NOTE — ED Notes (Signed)
Pt to nurse's desk stating she needs to leave. Dr Rosalia Hammers notified.

## 2023-05-24 NOTE — Discharge Instructions (Addendum)
You were seen in the emergency department today for evaluation of your lightheadedness and pelvic pain.Your lab work and EKG were reassuring.  Your urine did not show signs of an infection, BV, yeast infection.  We did send some tests that will not result today, but you will be contacted if these return positive.  Your pregnancy hormone is very slightly elevated.  This could be reflective of a very early pregnancy.  It is very important that you follow-up closely with your OB/GYN group.  Typically, they will perform a repeat hormone test called a beta hCG 48 hours after your blood work was drawn today, but please contact the office to see what plan they recommend for you.  If you have worsening vaginal bleeding, severe abdominal pain, or any other new or concerning symptoms, please return to the ER for further evaluation.

## 2023-09-13 IMAGING — US US PELVIS COMPLETE TRANSABD/TRANSVAG W DUPLEX
1 series · 13 of 25 positions shown · non-contrast
Comparison: None.

CLINICAL DATA: Left lower quadrant pain

EXAM:
TRANSABDOMINAL AND TRANSVAGINAL ULTRASOUND OF PELVIS
DOPPLER ULTRASOUND OF OVARIES
TECHNIQUE: Both transabdominal and transvaginal ultrasound examinations of the
pelvis were performed. Transabdominal technique was performed for
global imaging of the pelvis including uterus, ovaries, adnexal
regions, and pelvic cul-de-sac.
It was necessary to proceed with endovaginal exam following the
transabdominal exam to visualize the ovaries. Color and duplex
Doppler ultrasound was utilized to evaluate blood flow to the
ovaries.

[Series 1: us pelvic complete w transvaginal and torsion righ · 13 of 114 slices shown]
[im 1/114]
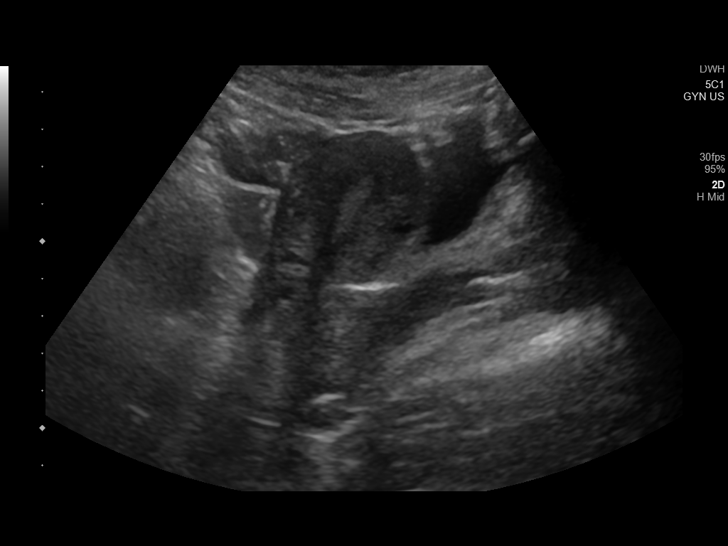
[im 10/114]
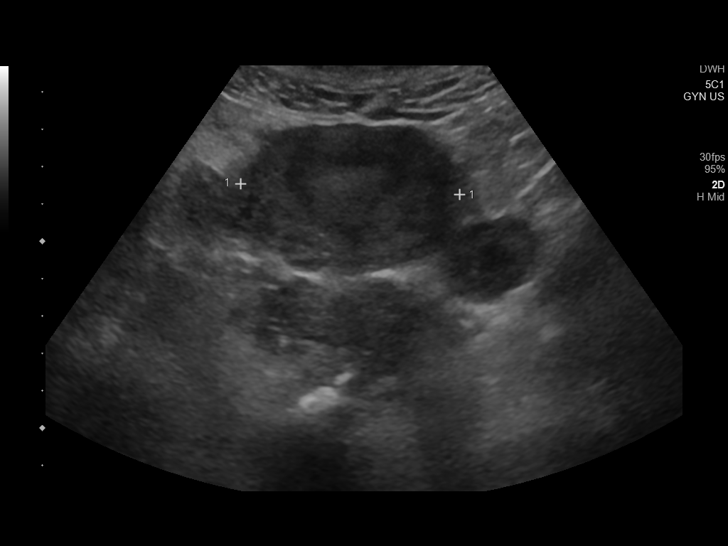
[im 19/114]
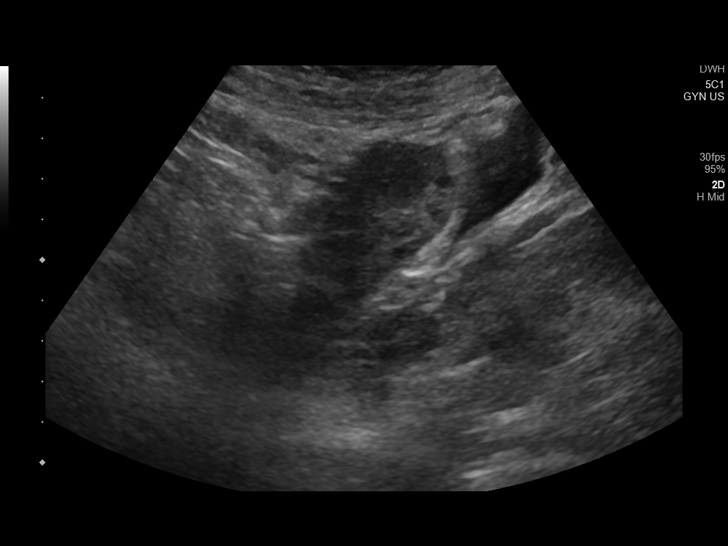
[im 29/114]
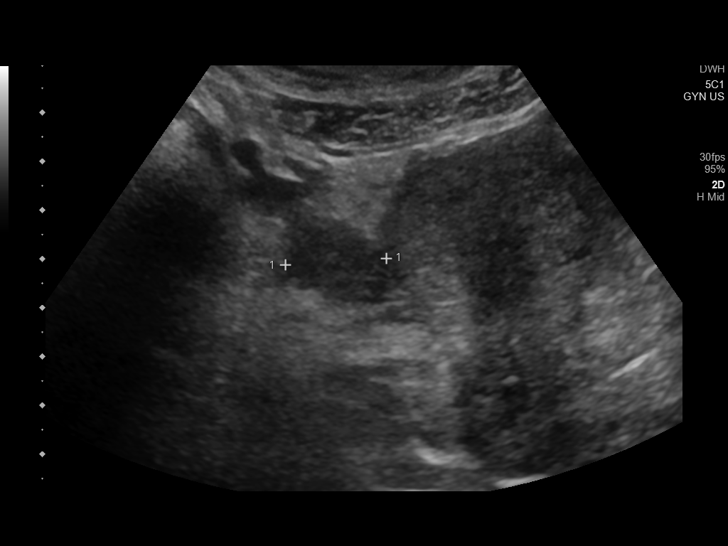
[im 38/114]
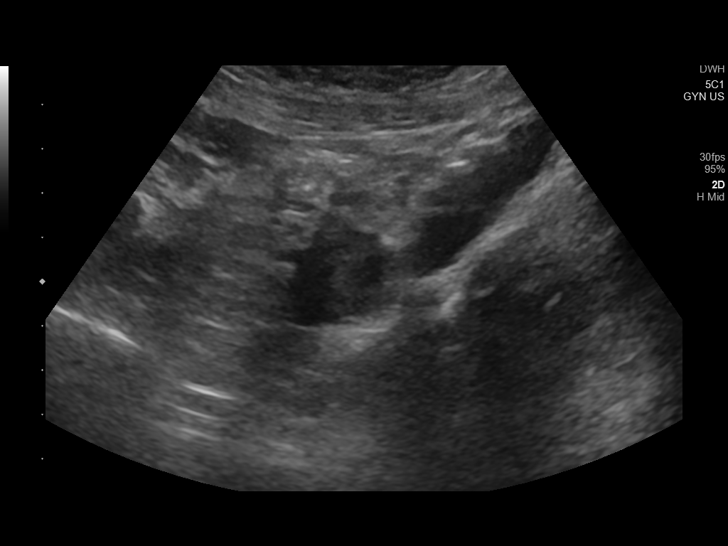
[im 48/114]
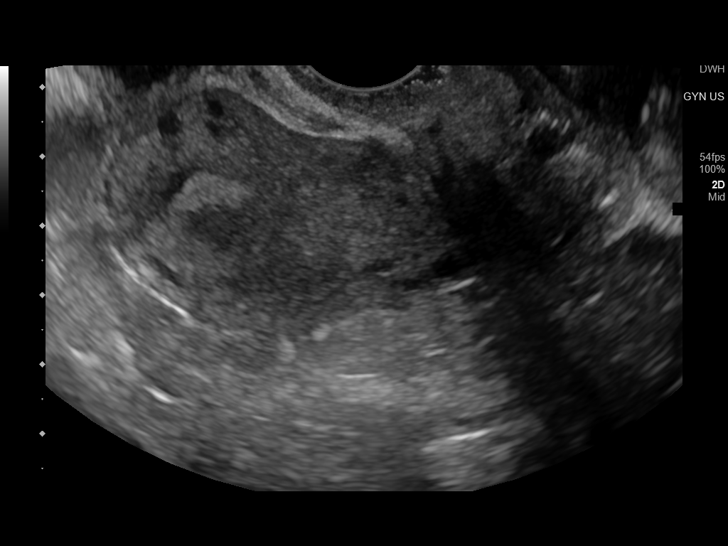
[im 57/114]
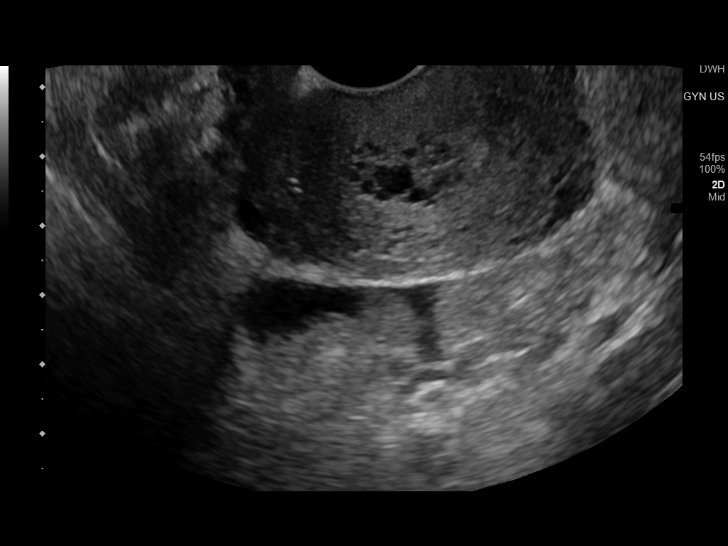
[im 66/114]
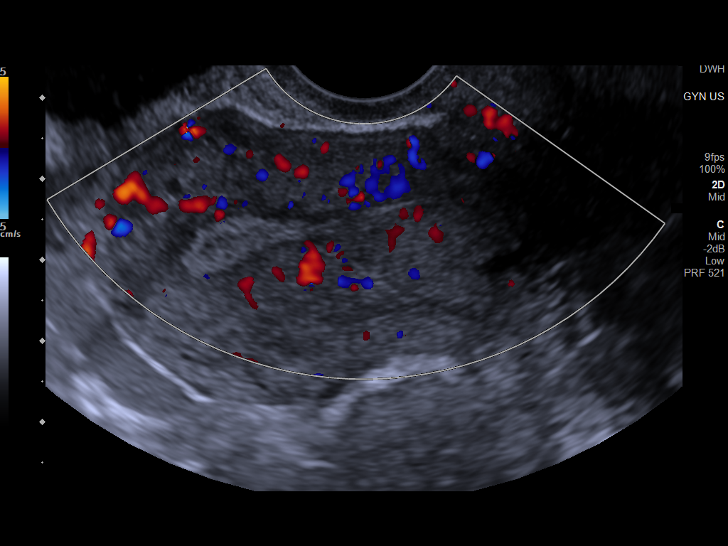
[im 76/114]
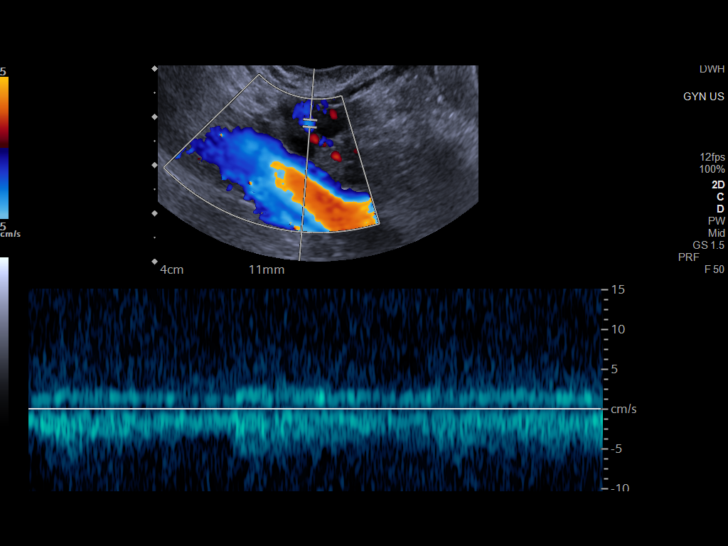
[im 85/114]
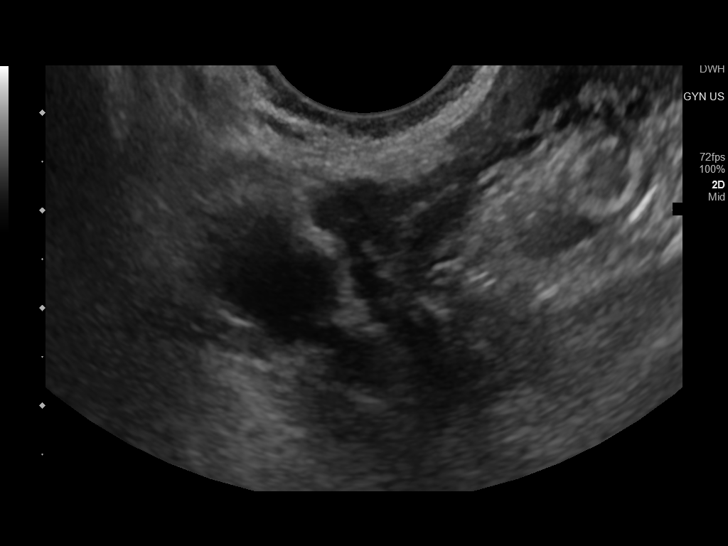
[im 95/114]
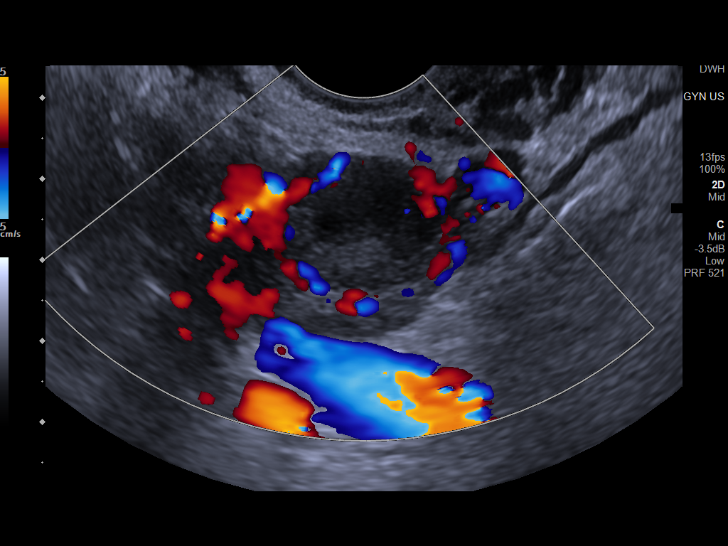
[im 104/114]
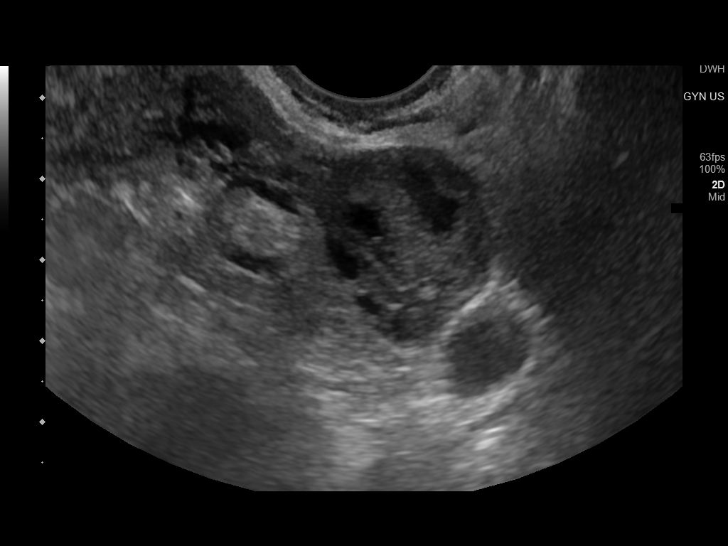
[im 114/114]
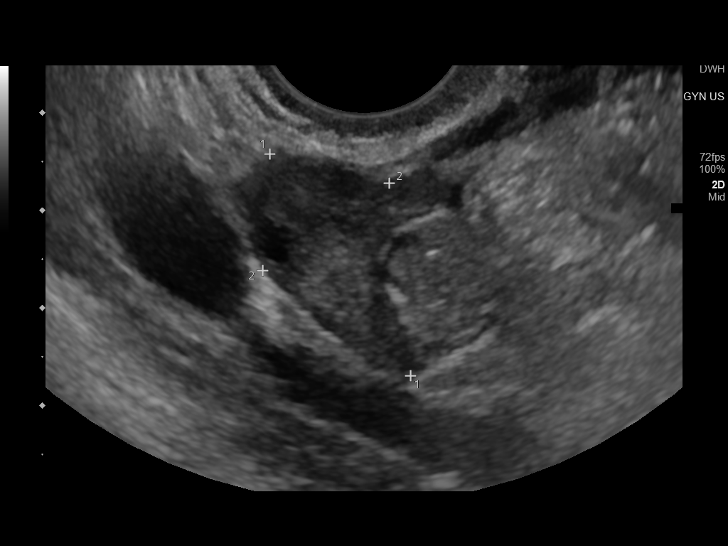

[13 of 25 positions shown; findings below may reference images not displayed]

FINDINGS: Uterus

Measurements: 7.6 x 3.6 x 5.9 cm. = volume: 84 mL. No fibroids or
other mass visualized.

Endometrium

Thickness: 6 mm.  No focal abnormality visualized.

Right ovary

Measurements: 2.7 x 1.6 x 1.8 cm. = volume: 4 mL. Normal
appearance/no adnexal mass.

Left ovary

Measurements: 3.1 x 2.3 x 2.3 cm. = volume: 8.5 mL. Small corpus
luteum cyst measuring up to 23.3 cm is noted.

Pulsed Doppler evaluation of both ovaries demonstrates normal
low-resistance arterial and venous waveforms.

Other findings

No abnormal free fluid.
IMPRESSION: No acute abnormality noted.

## 2023-10-14 ENCOUNTER — Emergency Department
Admission: EM | Admit: 2023-10-14 | Discharge: 2023-10-14 | Disposition: A | Discharge status: Home / Self Care | Payer: Medicaid Other | Attending: Emergency Medicine | Admitting: Emergency Medicine

## 2023-10-14 ENCOUNTER — Emergency Department: Payer: Medicaid Other

## 2023-10-14 ENCOUNTER — Encounter: Payer: Self-pay | Admitting: Emergency Medicine

## 2023-10-14 ENCOUNTER — Other Ambulatory Visit: Payer: Self-pay

## 2023-10-14 DIAGNOSIS — B379 Candidiasis, unspecified: Secondary | ICD-10-CM | POA: Diagnosis not present

## 2023-10-14 DIAGNOSIS — M5442 Lumbago with sciatica, left side: Secondary | ICD-10-CM | POA: Diagnosis not present

## 2023-10-14 DIAGNOSIS — M545 Low back pain, unspecified: Secondary | ICD-10-CM | POA: Diagnosis present

## 2023-10-14 LAB — CHLAMYDIA/NGC RT PCR (ARMC ONLY)
Chlamydia Tr: NOT DETECTED
N gonorrhoeae: NOT DETECTED

## 2023-10-14 LAB — POC URINE PREG, ED: Preg Test, Ur: NEGATIVE

## 2023-10-14 MED ORDER — FLUCONAZOLE 150 MG PO TABS: 150.0000 mg | ORAL_TABLET | Freq: Every day | ORAL | 0 refills | Status: AC

## 2023-10-14 MED ORDER — LIDOCAINE 5 % EX PTCH: 1.0000 | MEDICATED_PATCH | Freq: Two times a day (BID) | CUTANEOUS | 0 refills | Status: AC

## 2023-10-14 MED ORDER — CYCLOBENZAPRINE HCL 5 MG PO TABS: 5.0000 mg | ORAL_TABLET | Freq: Three times a day (TID) | ORAL | 0 refills | Status: AC | PRN

## 2023-10-14 MED ORDER — ACETAMINOPHEN 500 MG PO TABS
1000.0000 mg | ORAL_TABLET | Freq: Once | ORAL | Status: AC
  Administered 2023-10-14: 1000 mg via ORAL
  Filled 2023-10-14: qty 2

## 2023-10-14 MED ORDER — OXYCODONE HCL 5 MG PO TABS
5.0000 mg | ORAL_TABLET | Freq: Once | ORAL | Status: AC
  Administered 2023-10-14: 5 mg via ORAL
  Filled 2023-10-14: qty 1

## 2023-10-14 MED ORDER — LIDOCAINE 5 % EX PTCH
1.0000 | MEDICATED_PATCH | CUTANEOUS | Status: DC
  Administered 2023-10-14: 1 via TRANSDERMAL
  Filled 2023-10-14: qty 1

## 2023-10-14 MED ORDER — KETOROLAC TROMETHAMINE 30 MG/ML IJ SOLN
30.0000 mg | Freq: Once | INTRAMUSCULAR | Status: AC
  Administered 2023-10-14: 30 mg via INTRAMUSCULAR
  Filled 2023-10-14: qty 1

## 2023-10-14 MED ORDER — IBUPROFEN 600 MG PO TABS: 600.0000 mg | ORAL_TABLET | Freq: Four times a day (QID) | ORAL | 0 refills | Status: AC | PRN

## 2023-10-14 NOTE — ED Provider Notes (Signed)
[facility] Provider Note    None    (approximate)   History   Back Pain   HPI  [name] is a 37 y.o. female with history of back pain who comes in with concerns for back pain.  I reviewed her record from 05/12/2022 where patient was given naproxen due to SI joint pain.  Patient reports that she is having pain on her left lower back back but also a little bit in her left lower abdomen.  She reports that the pain seems to radiate everywhere but mostly down her left leg.  She does report a history of ovarian cyst.  She states that she has had this pain previously off and on but seems to be more severe today.  Is worse with movements.  She does report a little bit of discharge but was just tested yesterday and was positive for a yeast.  She never had a gonorrhea and chlamydia test done though.  She denies any shortness of breath.  She denies any urinary symptoms.  She just had a urine yesterday that was negative for pregnancy and negative for UTI.  She denies taking anything for the pain.  She reports having an accident with lifting something back in 2017 and has had recurrent back issues since then but is never followed up with a specialist.  She denies any numbness, tingling, urinary or bladder incontinence.  No fevers no IV drug use      Physical Exam   Triage Vital Signs: ED Triage Vitals  Encounter Vitals Group     BP 10/14/23 0652 122/72     Systolic BP Percentile --      Diastolic BP Percentile --      Pulse Rate 10/14/23 0652 87     Resp 10/14/23 0652 16     Temp 10/14/23 0652 97.6 F (36.4 C)     Temp Source 10/14/23 0652 Oral     SpO2 10/14/23 0652 99 %     Weight 10/14/23 0648 150 lb (68 kg)     Height 10/14/23 0648 5\' 1"  (1.549 m)     Head Circumference --      Peak Flow --      Pain Score 10/14/23 0648 8     Pain Loc --      Pain Education --      Exclude from Growth Chart --     Most recent vital signs: Vitals:   10/14/23 0652   BP: 122/72  Pulse: 87  Resp: 16  Temp: 97.6 F (36.4 C)  SpO2: 99%     General: Awake, no distress.  CV:  Good peripheral perfusion.  Resp:  Normal effort.  Abd:  No distention.  Other:  Good distal pulse in her left foot.  Able to lift up the left leg off the bed although she does report pain with doing so.  She is able to ambulate but has pain in the left hip with movements.  She has some mild tenderness to the left lower quadrant without any rebound or guarding - sensation is intact in the lower extremities.  No CTL spine tenderness   ED Results / Procedures / Treatments   Labs (all labs ordered are listed, but only abnormal results are displayed) Labs Reviewed  CHLAMYDIA/NGC RT PCR ([facility] ONLY)            POC URINE PREG, ED      RADIOLOGY I have reviewed the ultrasound personally and  interpreted and no cyst   PROCEDURES:  Critical Care performed: No  Procedures   MEDICATIONS ORDERED IN ED: Medications  ketorolac (TORADOL) 30 MG/ML injection 30 mg (has no administration in time range)  acetaminophen (TYLENOL) tablet 1,000 mg (has no administration in time range)  lidocaine (LIDODERM) 5 % 1 patch (has no administration in time range)     IMPRESSION / MDM / ASSESSMENT AND PLAN / ED COURSE  I reviewed the triage vital signs and the nursing notes.   Patient's presentation is most consistent with acute presentation with potential threat to life or bodily function.   Patient comes in with left lower quadrant abdominal pain, left hip pain radiating down the leg.  Suspect this is most likely sciatica given recurrent episodes of similar however she does have a history of hemorrhagic cyst and she does state that sometimes her pain is more in the lower abdomen so we will get ultrasound to rule out cyst.  She also is requesting some gonorrhea and Chlamydia testing.  She was positive for yeast yesterday but never stayed to get treatment so we will give her a dose of  fluconazole for at home.  Patient treated symptomatically with Tylenol, Toradol, lidocaine patch.  IMPRESSION:  No acute sonographic abnormality.   G, C were negative.  preg test negative  Repeat evaluation patient reports feeling better.  She feels comfortable with discharge home.  We discussed conservative treatment for back pain and following up with orthopedics as needed.  We discussed return precautions in regards to cord compression but at this time she has no symptoms of such.  We discussed Flexeril and not driving on it and she expressed understanding felt comfortable with discharge home   FINAL CLINICAL IMPRESSION(S) / ED DIAGNOSES   Final diagnoses:  Yeast infection  Acute left-sided low back pain with left-sided sciatica     Rx / DC Orders   ED Discharge Orders          Ordered    fluconazole (DIFLUCAN) 150 MG tablet  Daily        10/14/23 0754    ibuprofen (ADVIL) 600 MG tablet  Every 6 hours PRN        10/14/23 1000    lidocaine (LIDODERM) 5 %  Every 12 hours        10/14/23 1000    cyclobenzaprine (FLEXERIL) 5 MG tablet  3 times daily PRN        10/14/23 1000             Note:  This document was prepared using Dragon voice recognition software and may include unintentional dictation errors.   Concha Se, MD 10/14/23 1001

## 2023-10-14 NOTE — ED Triage Notes (Signed)
Patient ambulatory to triage with complaints of back pain shooting down her left leg. States she feels she pulled a muscle years ago and now has pain on and off sometimes. She endorses worsening pain when standing. Denies loss of bowel or bladder.

## 2023-10-14 NOTE — ED Notes (Signed)
See triage note  Presents with lower back pain  States pain is moving into left leg  Denies any specific injury  Ambulates well

## 2023-10-14 NOTE — Discharge Instructions (Addendum)
You can take the medications with Tylenol 1 g every 8 hours and I also prescribed some fluconazole for your yeast infection that was found yesterday.  You should follow-up with a orthopedic doctor or your sports doctor to discuss your recurrent episodes of back pain for further evaluation and preventative care.  Return to ER if develop fevers, numbness, tingling, urinating on yourself or any other concerns

## 2023-10-21 IMAGING — US US ABDOMEN LIMITED
1 series · 14 of 25 positions shown · non-contrast
Comparison: None.

CLINICAL DATA: Right upper quadrant pain.

EXAM:
ULTRASOUND ABDOMEN LIMITED RIGHT UPPER QUADRANT

[Series 1: general · 14 of 235 slices shown]
[im 1/235]
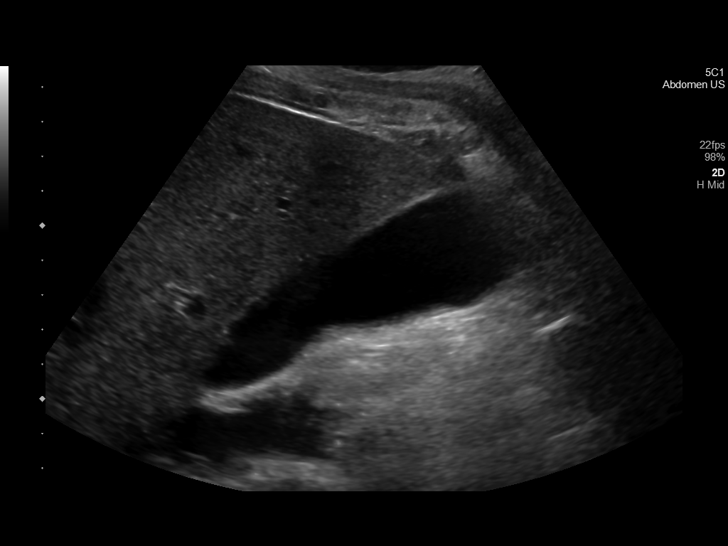
[im 20/235]
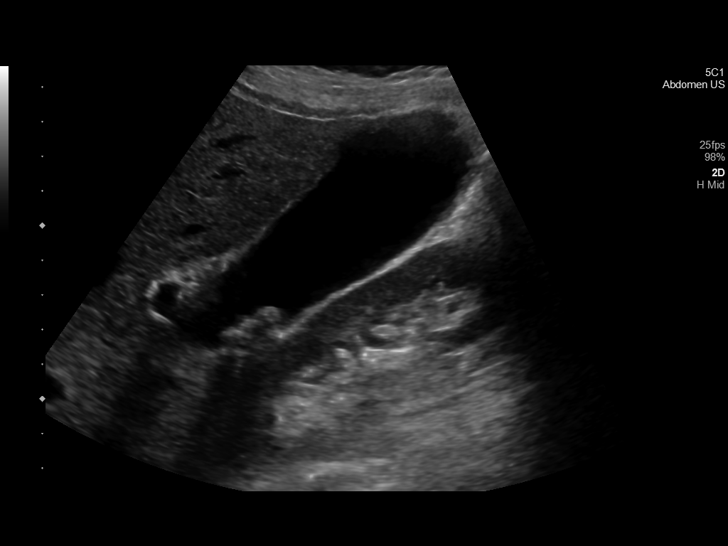
[im 40/235]
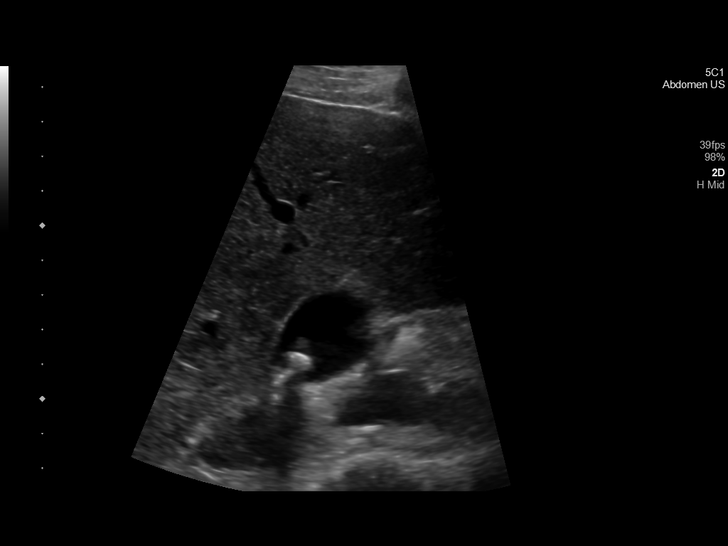
[im 59/235]
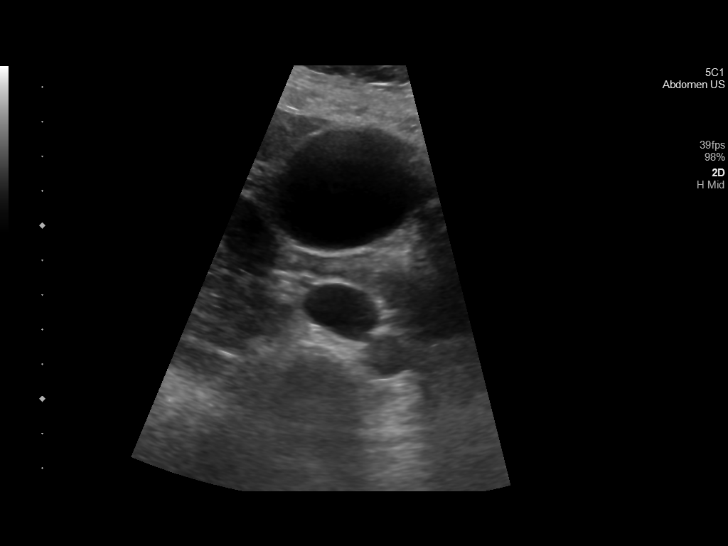
[im 79/235]
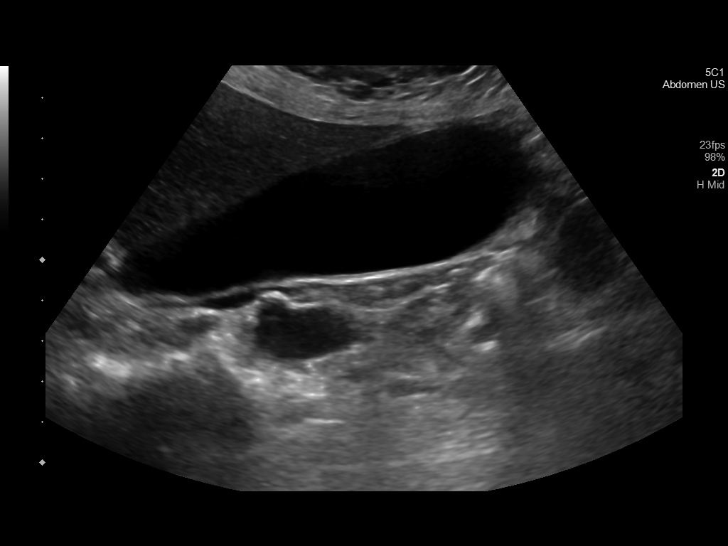
[im 88/235]
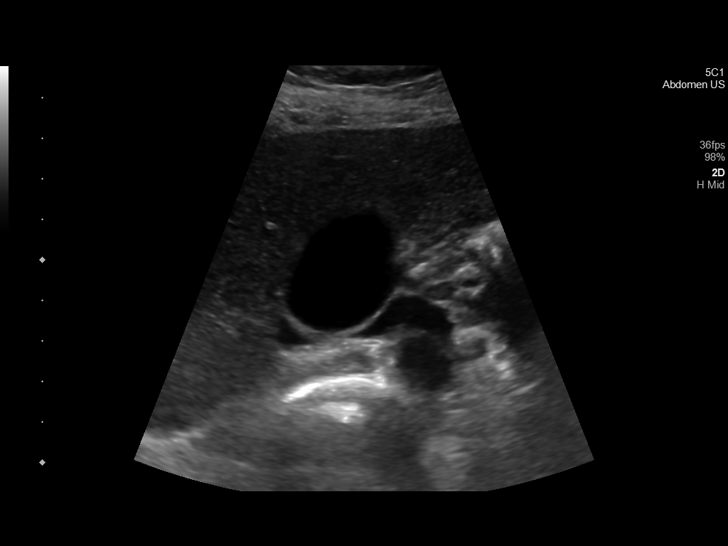
[im 108/235]
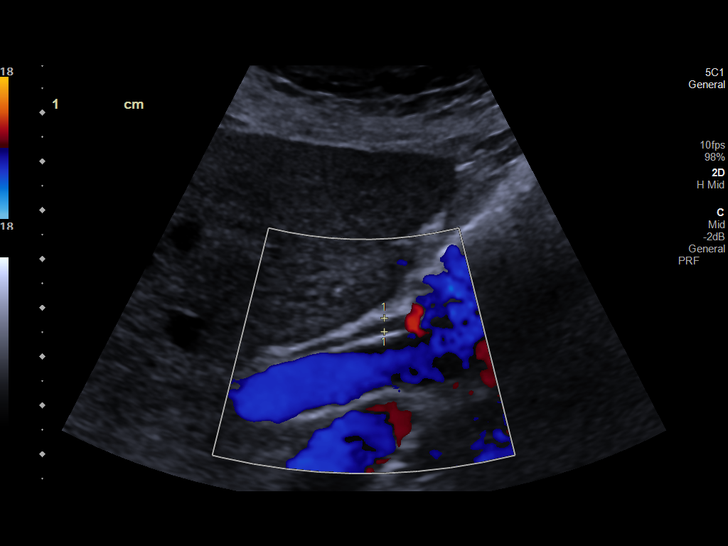
[im 127/235]
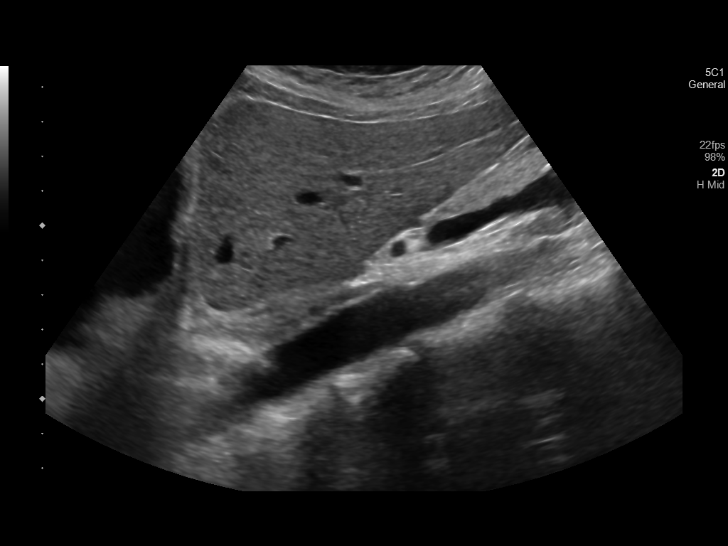
[im 147/235]
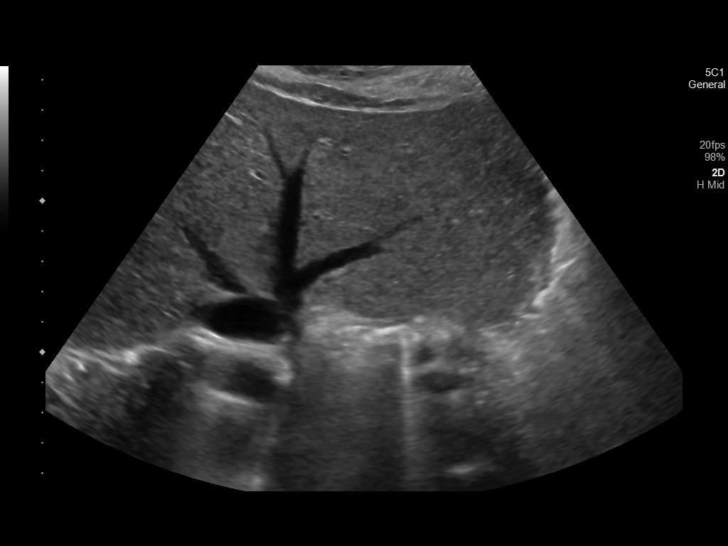
[im 157/235]
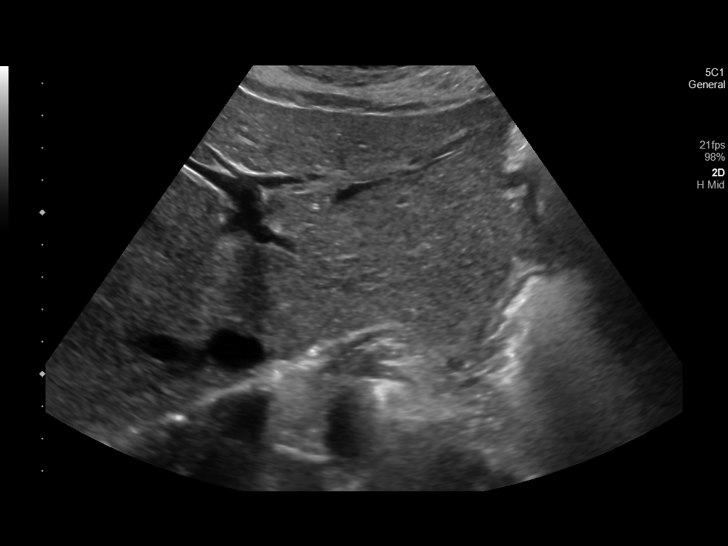
[im 176/235]
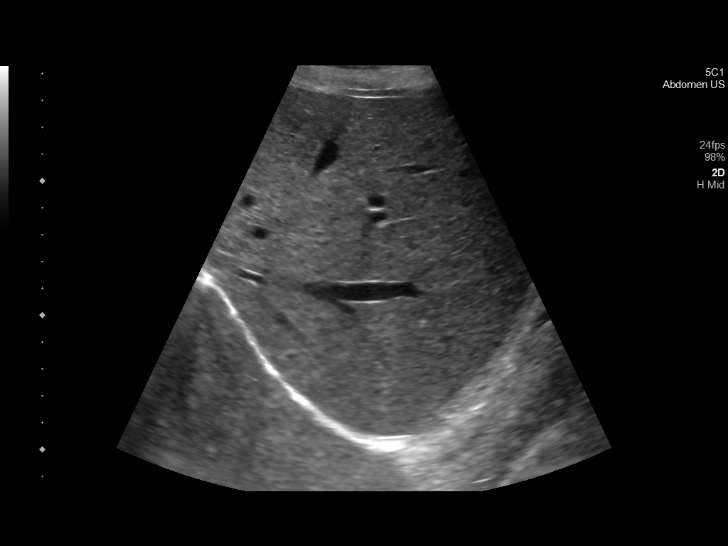
[im 196/235]
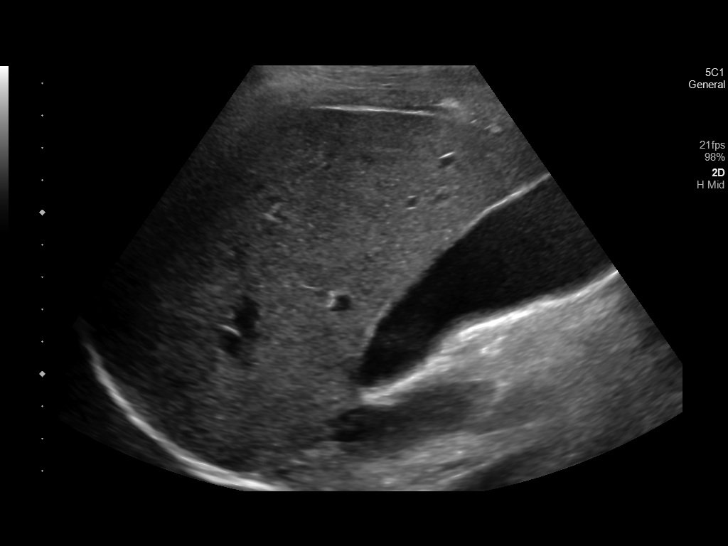
[im 215/235]
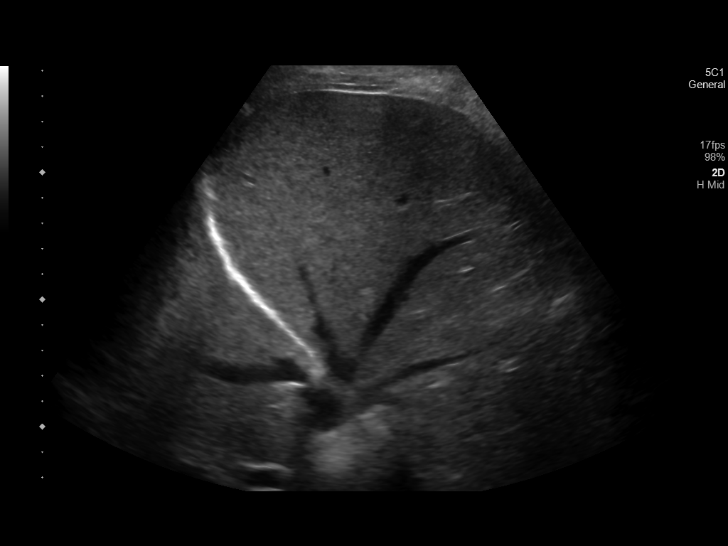
[im 235/235]
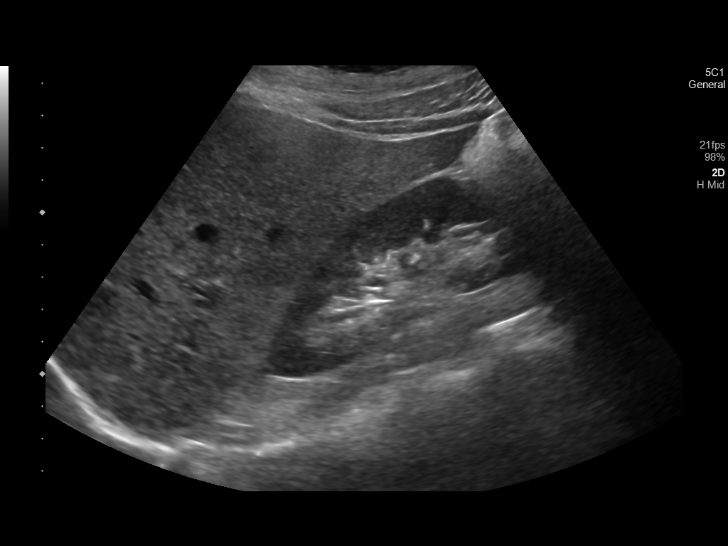

[14 of 25 positions shown; findings below may reference images not displayed]

FINDINGS: Gallbladder:

Multiple mobile gallstones measuring up to 9 mm. No gallbladder wall
thickening visualized. No sonographic Murphy sign noted by
sonographer.

Common bile duct:

Diameter: 2.8 mm

Liver:

No focal lesion identified. Diffusely increased parenchymal
echogenicity. Portal vein is patent on color Doppler imaging with
normal direction of blood flow towards the liver.

Other: None.
IMPRESSION: Cholelithiasis without CT evidence of acute cholecystitis.

Diffusely increased echogenicity of the liver usually seen with
hepatic steatosis or fibrosis.

## 2023-11-03 ENCOUNTER — Encounter: Payer: Medicaid Other | Admitting: Obstetrics

## 2023-11-10 ENCOUNTER — Encounter: Payer: Medicaid Other | Admitting: Licensed Practical Nurse

## 2023-11-23 ENCOUNTER — Telehealth: Payer: Medicaid Other

## 2023-12-03 ENCOUNTER — Ambulatory Visit
Admission: RE | Admit: 2023-12-03 | Discharge: 2023-12-03 | Discharge status: Against Medical Advice | Payer: Medicaid Other | Source: Ambulatory Visit

## 2023-12-03 NOTE — ED Notes (Addendum)
Pt left before triage, pt wanted to change her appointment for tomorrow.

## 2023-12-04 ENCOUNTER — Telehealth (HOSPITAL_COMMUNITY): Payer: Self-pay | Admitting: Psychiatry

## 2023-12-04 ENCOUNTER — Inpatient Hospital Stay
Admission: EM | Admit: 2023-12-04 | Discharge: 2023-12-04 | Discharge status: Home / Self Care | Payer: Medicaid Other | Attending: Emergency Medicine | Admitting: Emergency Medicine

## 2023-12-04 ENCOUNTER — Encounter (HOSPITAL_COMMUNITY): Payer: Self-pay | Admitting: Psychiatry

## 2023-12-04 VITALS — BP 123/79 | HR 82 | Temp 99.1°F | Resp 16

## 2023-12-04 DIAGNOSIS — F319 Bipolar disorder, unspecified: Secondary | ICD-10-CM | POA: Diagnosis not present

## 2023-12-04 DIAGNOSIS — F1220 Cannabis dependence, uncomplicated: Secondary | ICD-10-CM

## 2023-12-04 DIAGNOSIS — Z79899 Other long term (current) drug therapy: Secondary | ICD-10-CM

## 2023-12-04 DIAGNOSIS — Z3201 Encounter for pregnancy test, result positive: Secondary | ICD-10-CM

## 2023-12-04 DIAGNOSIS — F4310 Post-traumatic stress disorder, unspecified: Secondary | ICD-10-CM | POA: Diagnosis not present

## 2023-12-04 LAB — POCT URINE PREGNANCY: Preg Test, Ur: POSITIVE — AB

## 2023-12-04 MED ORDER — HYDROXYZINE HCL 25 MG PO TABS: 12.5000 mg | ORAL_TABLET | Freq: Three times a day (TID) | ORAL | 1 refills | Status: DC | PRN

## 2023-12-04 MED ORDER — DOXYLAMINE-PYRIDOXINE 10-10 MG PO TBEC: DELAYED_RELEASE_TABLET | ORAL | 1 refills | Status: DC

## 2023-12-04 MED ORDER — LAMOTRIGINE 25 MG PO TABS
ORAL_TABLET | ORAL | 0 refills | Status: DC
Start: 2023-12-04 — End: 2024-02-02

## 2023-12-04 NOTE — Discharge Instructions (Signed)
Your pregnancy test today is positive.   Your will need evaluation from a women's health provider to determine gestational age( how far along you are) and estimated due date. This is necessary whether you determine to move forward with pregnancy or not.   If moving forward with pregnancy, Information has been given for the [facility] or the [facility] Department to establish care. You may go to any provider you deem fit. It is important to receive care throughout pregnancy to keep you and baby safe.   If moving forward with pregnancy, you may start taking prenatal vitamin daily.   If you do, please stop drinking alcohol or smoking nicotine substances (cigarettes, vaping and hookah)  please stop any illicit drug use including marijuana, if this is a struggle for you, please notify for your women's health providers so they may help you   Inside your packet is a list of medications that are safe for you to use.

## 2023-12-04 NOTE — ED Triage Notes (Signed)
Pt states she had a positive pregnancy test at home and needs a confirmation test to get in with her ob-gyn.

## 2023-12-04 NOTE — ED Provider Notes (Signed)
[name]    CSN: [id number] Arrival date & time: 12/04/23  1155      History   Chief Complaint Chief Complaint  Patient presents with   Possible Pregnancy    HPI [name] is a 37 y.o. female.   Patient presents for pregnancy test, needed to establish care with OB/GYN, home testing positive. last known menstrual period November 01, 2023.  Has been experiencing nausea, taking old prescription of Zofran.  Past Medical History:  Diagnosis Date   Anxiety    Depression     Patient Active Problem List   Diagnosis Date Noted   PTSD (post-traumatic stress disorder) 12/04/2023   Bipolar and related disorder (HCC) 12/04/2023   Cannabis use disorder, moderate, dependence (HCC) 12/04/2023   High risk medication use 12/04/2023    Past Surgical History:  Procedure Laterality Date   CESAREAN SECTION     CHOLECYSTECTOMY     DIAGNOSTIC LAPAROSCOPY WITH REMOVAL OF ECTOPIC PREGNANCY Left 07/09/2021   Procedure: DIAGNOSTIC LAPAROSCOPY WITH REMOVAL OF ECTOPIC PREGNANCY; LEFT SALPINGECTOMY;  Surgeon: Suzy Bouchard, MD;  Location: [facility] ORS;  Service: Gynecology;  Laterality: Left;   LAPAROSCOPIC OVARIAN CYSTECTOMY Right 07/09/2021   Procedure: LAPAROSCOPIC OVARIAN CYSTECTOMY;  Surgeon: Schermerhorn, Ihor Austin, MD;  Location: [facility] ORS;  Service: Gynecology;  Laterality: Right;    OB History     Gravida  2   Para      Term      Preterm      AB      Living         SAB      IAB      Ectopic      Multiple      Live Births               Home Medications    Prior to Admission medications   Medication Sig Start Date End Date Taking? Authorizing Provider  Doxylamine-Pyridoxine 10-10 MG TBEC Two tablets at bedtime on day 1 and 2; if symptoms persist, take 1 tablet in morning and 2 tablets at bedtime on day 3; if symptoms persist, may increase to 1 tablet in morning, 1 tablet mid-afternoon, and 2 tablets at bedtime on day 4 (maximum:  doxylamine 40 mg/pyridoxine 40 mg 12/04/23  Yes Sriyan Cutting R, NP  Cholecalciferol (VITAMIN D) 125 MCG (5000 UT) CAPS Take 1 capsule by mouth daily.    [provider]  hydrOXYzine (ATARAX) 25 MG tablet Take 0.5-1 tablets (12.5-25 mg total) by mouth 3 (three) times daily as needed for anxiety. 12/04/23   Jomarie Longs, MD  ipratropium (ATROVENT) 0.06 % nasal spray Place 2 sprays into both nostrils 4 (four) times daily. 12/26/21   Becky Augusta, NP  lamoTRIgine (LAMICTAL) 25 MG tablet Take 1 tablet (25 mg total) by mouth daily for 30 days, THEN 1 tablet (25 mg total) 2 (two) times daily. 12/04/23 02/02/24  Jomarie Longs, MD  Multiple Vitamin (MULTIVITAMIN) tablet Take 1 tablet by mouth daily.    [provider]  ondansetron (ZOFRAN-ODT) 4 MG disintegrating tablet Take 1 tablet (4 mg total) by mouth every 8 (eight) hours as needed for nausea or vomiting. 01/10/23   Tommi Rumps, PA-C  pantoprazole (PROTONIX) 20 MG tablet Take 1 tablet (20 mg total) by mouth daily. 01/10/23 01/10/24  Tommi Rumps, PA-C    Family History Family History  Problem Relation Age of Onset   Drug abuse Father    ADD / ADHD Sister  Drug abuse Sister    ADD / ADHD Brother    Drug abuse Brother    ADD / ADHD Brother    Bipolar disorder Maternal Aunt     Social History Social History   Tobacco Use   Smoking status: Former    Types: E-cigarettes   Smokeless tobacco: Never  Vaping Use   Vaping status: Former   Substances: Mixture of cannabinoids  Substance Use Topics   Alcohol use: Yes    Comment: Occasionally   Drug use: Not Currently    Types: Marijuana     Allergies   Patient has no known allergies.   Review of Systems Review of Systems   Physical Exam Triage Vital Signs ED Triage Vitals [12/04/23 1205]  Encounter Vitals Group     BP 123/79     Systolic BP Percentile      Diastolic BP Percentile      Pulse Rate 82     Resp 16     Temp 99.1 F (37.3 C)     Temp  Source Oral     SpO2 99 %     Weight      Height      Head Circumference      Peak Flow      Pain Score      Pain Loc      Pain Education      Exclude from Growth Chart    No data found.  Updated Vital Signs BP 123/79 (BP Location: Left Arm)   Pulse 82   Temp 99.1 F (37.3 C) (Oral)   Resp 16   LMP 11/01/2023 (Exact Date)   SpO2 99%   Visual Acuity Right Eye Distance:   Left Eye Distance:   Bilateral Distance:    Right Eye Near:   Left Eye Near:    Bilateral Near:     Physical Exam Constitutional:      Appearance: Normal appearance.  HENT:     Head: Normocephalic.  Eyes:     Extraocular Movements: Extraocular movements intact.  Pulmonary:     Effort: Pulmonary effort is normal.  Neurological:     Mental Status: She is alert and oriented to person, place, and time. Mental status is at baseline.      UC Treatments / Results  Labs (all labs ordered are listed, but only abnormal results are displayed) Labs Reviewed  POCT URINE PREGNANCY - Abnormal; Notable for the following components:      Result Value   Preg Test, Ur Positive (*)    All other components within normal limits    EKG   Radiology No results found.  Procedures Procedures (including critical care time)  Medications Ordered in UC Medications - No data to display  Initial Impression / Assessment and Plan / UC Course  I have reviewed the triage vital signs and the nursing notes.  Pertinent labs & imaging results that were available during my care of the patient were reviewed by me and considered in my medical decision making (see chart for details).   Positive pregnancy test  Urine pregnancy positive, proximately 4 to 5 weeks based on last menstruation, discussed with patient, advise discontinuation of Zofran and Diclegis sent to pharmacy recommended if moving forward with pregnancy to begin prenatal with avoidance of alcohol, tobacco or drug usage, patient to notify OB/GYN office of  pregnancy status for follow-up appointment, discussed any signs of miscarriage to go to the nearest emergency department Final Clinical Impressions(s) /  UC Diagnoses   Final diagnoses:  Positive pregnancy test     Discharge Instructions      Your pregnancy test today is positive.   Your will need evaluation from a women's health provider to determine gestational age( how far along you are) and estimated due date. This is necessary whether you determine to move forward with pregnancy or not.   If moving forward with pregnancy, Information has been given for the [facility] or the [facility] Department to establish care. You may go to any provider you deem fit. It is important to receive care throughout pregnancy to keep you and baby safe.   If moving forward with pregnancy, you may start taking prenatal vitamin daily.   If you do, please stop drinking alcohol or smoking nicotine substances (cigarettes, vaping and hookah)  please stop any illicit drug use including marijuana, if this is a struggle for you, please notify for your women's health providers so they may help you   Inside your packet is a list of medications that are safe for you to use.     ED Prescriptions     Medication Sig Dispense Auth. Provider   Doxylamine-Pyridoxine 10-10 MG TBEC Two tablets at bedtime on day 1 and 2; if symptoms persist, take 1 tablet in morning and 2 tablets at bedtime on day 3; if symptoms persist, may increase to 1 tablet in morning, 1 tablet mid-afternoon, and 2 tablets at bedtime on day 4 (maximum: doxylamine 40 mg/pyridoxine 40 mg 60 tablet Lizzeth Meder, Elita Boone, NP      PDMP not reviewed this encounter.   Valinda Hoar, NP 12/04/23 1229
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
## 2023-12-04 NOTE — Patient Instructions (Signed)
 [email or web address]  [email or web address]  [email or web address] [facility] [address], [place], [place] [address]   [phone]  [facility] [email or web address] [address] [place], [place] [address]  [phone]  [facility] [email or web address] [address] [place], [place] [address]  [phone]   [facility] - [phone]  [facility] - [phone]  [facility] - [phone] counseling [phone]  [facility] [phone]   [email or web address] this clinician can offer telehealth and has a sliding scale option  [email or web address] this group also offers sliding scale rates and is based out of [place]  Dr. Liborio Nixon with the [facility] specializes in divorce  [employer] [facility] has interns who offer sliding scale rates and some of the full time clinicians do, as well. You complete their contact form on their website and the referrals coordinator will help to get connected to someone   Medicaid below :  [facility] [address] [place], [place] [address]  [phone]    [employer] [address] [place], [place] [address]  [phone]    [facility] [address] [place], [place] [address]  [phone]      Hydroxyzine Capsules or Tablets What is this medication? HYDROXYZINE (hye DROX i zeen) treats the symptoms of allergies and allergic reactions. It may also be used to treat anxiety or cause drowsiness before a procedure. It works by blocking histamine, a substance released by the body during an allergic reaction. It belongs to a group of medications called antihistamines. This medicine may be  used for other purposes; ask your health care provider or pharmacist if you have questions. COMMON BRAND NAME(S): ANX, Atarax, Rezine, Vistaril What should I tell my care team before I take this medication? They need to know if you have any of these conditions: Glaucoma Heart disease Irregular heartbeat or rhythm Kidney disease Liver disease Lung or breathing disease, such as asthma Stomach or intestine problems Thyroid disease Trouble passing urine An unusual or allergic reaction to hydroxyzine, other medications, foods, dyes or preservatives Pregnant or trying to get pregnant Breastfeeding How should I use this medication? Take this medication by mouth with a full glass of water. Take it as directed on the prescription label at the same time every day. You can take it with or without food. If it upsets your stomach, take it with food. Talk to your care team about the use of this medication in children. While it may be prescribed for children as young as 6 years for selected conditions, precautions do apply. People 65 years and older may have a stronger reaction and need a smaller dose. Overdosage: If you think you have taken too much of this medicine contact a poison control center or emergency room at once. NOTE: This medicine is only for you. Do not share this medicine with others. What if I miss a dose? If you miss a dose, take it as soon as you can. If it is almost time for your next dose, take only that dose. Do not take double or extra doses. What may interact with this medication? Do not take this medication with any of the following: Cisapride Dronedarone Pimozide Thioridazine This medication may also interact with the following: Alcohol Antihistamines for allergy, cough, and cold Atropine Barbiturate medications for sleep  or seizures, such as phenobarbital Certain antibiotics, such as erythromycin or clarithromycin Certain medications for anxiety or sleep Certain  medications for bladder problems, such as oxybutynin or tolterodine Certain medications for irregular heartbeat Certain medications for mental health conditions Certain medications for Parkinson disease, such as benztropine, trihexyphenidyl Certain medications for seizures, such as phenobarbital or primidone Certain medications for stomach problems, such as dicyclomine or hyoscyamine Certain medications for travel sickness, such as scopolamine Ipratropium Opioid medications for pain Other medications that cause heart rhythm changes, such as dofetilide This list may not describe all possible interactions. Give your health care provider a list of all the medicines, herbs, non-prescription drugs, or dietary supplements you use. Also tell them if you smoke, drink alcohol, or use illegal drugs. Some items may interact with your medicine. What should I watch for while using this medication? Visit your care team for regular checks on your progress. Tell your care team if your symptoms do not start to get better or if they get worse. This medication may affect your coordination, reaction time, or judgment. Do not drive or operate machinery until you know how this medication affects you. Sit up or stand slowly to reduce the risk of dizzy or fainting spells. Drinking alcohol with this medication can increase the risk of these side effects. Your mouth may get dry. Chewing sugarless gum or sucking hard candy and drinking plenty of water may help. Contact your care team if the problem does not go away or is severe. This medication may cause dry eyes and blurred vision. If you wear contact lenses, you may feel some discomfort. Lubricating eye drops may help. See your care team if the problem does not go away or is severe. If you are receiving skin tests for allergies, tell your care team you are taking this medication. What side effects may I notice from receiving this medication? Side effects that you should  report to your care team as soon as possible: Allergic reactions--skin rash, itching, hives, swelling of the face, lips, tongue, or throat Heart rhythm changes--fast or irregular heartbeat, dizziness, feeling faint or lightheaded, chest pain, trouble breathing Side effects that usually do not require medical attention (report to your care team if they continue or are bothersome): Confusion Drowsiness Dry mouth Hallucinations Headache This list may not describe all possible side effects. Call your doctor for medical advice about side effects. You may report side effects to FDA at 1-800-FDA-1088. Where should I keep my medication? Keep out of the reach of children and pets. Store at room temperature between 15 and 30 degrees C (59 and 86 degrees F). Keep container tightly closed. Throw away any unused medication after the expiration date. NOTE: This sheet is a summary. It may not cover all possible information. If you have questions about this medicine, talk to your doctor, pharmacist, or health care provider.  2024 Elsevier/Gold Standard (2022-07-24 00:00:00) Lamotrigine Tablets What is this medication? LAMOTRIGINE (la MOE Patrecia Pace) prevents and controls seizures in people with epilepsy. It may also be used to treat bipolar disorder. It works by calming overactive nerves in your body. This medicine may be used for other purposes; ask your health care provider or pharmacist if you have questions. COMMON BRAND NAME(S): Lamictal, Subvenite What should I tell my care team before I take this medication? They need to know if you have any of these conditions: Heart disease History of irregular heartbeat Immune system problems Kidney disease Liver disease Low levels of folic acid in  the blood Lupus Mental health condition Suicidal thoughts, plans, or attempt by you or a family member An unusual or allergic reaction to lamotrigine, other medications, foods, dyes, or preservatives Pregnant or  trying to get pregnant Breastfeeding How should I use this medication? Take this medication by mouth with a glass of water. Follow the directions on the prescription label. Do not chew these tablets. If this medication upsets your stomach, take it with food or milk. Take your doses at regular intervals. Do not take your medication more often than directed. A special MedGuide will be given to you by the pharmacist with each new prescription and refill. Be sure to read this information carefully each time. Talk to your care team about the use of this medication in children. While this medication may be prescribed for children as young as 2 years for selected conditions, precautions do apply. Overdosage: If you think you have taken too much of this medicine contact a poison control center or emergency room at once. NOTE: This medicine is only for you. Do not share this medicine with others. What if I miss a dose? If you miss a dose, take it as soon as you can. If it is almost time for your next dose, take only that dose. Do not take double or extra doses. What may interact with this medication? Atazanavir Certain medications for irregular heartbeat Certain medications for seizures, such as carbamazepine, phenobarbital, phenytoin, primidone, or valproic acid Estrogen or progestin hormones Lopinavir Rifampin Ritonavir This list may not describe all possible interactions. Give your health care provider a list of all the medicines, herbs, non-prescription drugs, or dietary supplements you use. Also tell them if you smoke, drink alcohol, or use illegal drugs. Some items may interact with your medicine. What should I watch for while using this medication? Visit your care team for regular checks on your progress. If you take this medication for seizures, wear a Medic Alert bracelet or necklace. Carry an identification card with information about your condition, medications, and care team. It is important  to take this medication exactly as directed. When first starting treatment, your dose will need to be adjusted slowly. It may take weeks or months before your dose is stable. You should contact your care team if your seizures get worse or if you have any new types of seizures. Do not stop taking this medication unless instructed by your care team. Stopping your medication suddenly can increase your seizures or their severity. This medication may cause serious skin reactions. They can happen weeks to months after starting the medication. Contact your care team right away if you notice fevers or flu-like symptoms with a rash. The rash may be red or purple and then turn into blisters or peeling of the skin. You may also notice a red rash with swelling of the face, lips, or lymph nodes in your neck or under your arms. This medication may affect your coordination, reaction time, or judgment. Do not drive or operate machinery until you know how this medication affects you. Sit up or stand slowly to reduce the risk of dizzy or fainting spells. Drinking alcohol with this medication can increase the risk of these side effects. If you are taking this medication for bipolar disorder, it is important to report any changes in your mood to your care team. If your condition gets worse, you get mentally depressed, feel very hyperactive or manic, have difficulty sleeping, or have thoughts of hurting yourself or committing suicide, you  need to get help from your care team right away. If you are a caregiver for someone taking this medication for bipolar disorder, you should also report these behavioral changes right away. The use of this medication may increase the chance of suicidal thoughts or actions. Pay special attention to how you are responding while on this medication. Your mouth may get dry. Chewing sugarless gum or sucking hard candy and drinking plenty of water may help. Contact your care team if the problem does not go  away or is severe. If you become pregnant while using this medication, you may enroll in the [place] American Antiepileptic Drug Pregnancy Registry by calling [phone]. This registry collects information about the safety of antiepileptic medication use during pregnancy. This medication may cause a decrease in folic acid. You should make sure that you get enough folic acid while you are taking this medication. Discuss the foods you eat and the vitamins you take with your care team. What side effects may I notice from receiving this medication? Side effects that you should report to your care team as soon as possible: Allergic reactions--skin rash, itching, hives, swelling of the face, lips, tongue, or throat Change in vision Fever, neck pain or stiffness, sensitivity to light, headache, nausea, vomiting, confusion Heart rhythm changes--fast or irregular heartbeat, dizziness, feeling faint or lightheaded, chest pain, trouble breathing Infection--fever, chills, cough, or sore throat Liver injury--right upper belly pain, loss of appetite, nausea, light-colored stool, dark yellow or brown urine, yellowing skin or eyes, unusual weakness or fatigue Low red blood cell count--unusual weakness or fatigue, dizziness, headache, trouble breathing Rash, fever, and swollen lymph nodes Redness, blistering, peeling or loosening of the skin, including inside the mouth Thoughts of suicide or self-harm, worsening mood, or feelings of depression Unusual bruising or bleeding Side effects that usually do not require medical attention (report to your care team if they continue or are bothersome): Diarrhea Dizziness Drowsiness Headache Nausea Stomach pain Tremors or shaking This list may not describe all possible side effects. Call your doctor for medical advice about side effects. You may report side effects to FDA at 1-800-FDA-1088. Where should I keep my medication? Keep out of the reach of children and  pets. Store at [employer] C (77 degrees F). Protect from light. Get rid of any unused medication after the expiration date. To get rid of medications that are no longer needed or have expired: Take the medication to a medication take-back program. Check with your pharmacy or law enforcement to find a location. If you cannot return the medication, check the label or package insert to see if the medication should be thrown out in the garbage or flushed down the toilet. If you are not sure, ask your care team. If it is safe to put it in the trash, empty the medication out of the container. Mix the medication with cat litter, dirt, coffee grounds, or other unwanted substance. Seal the mixture in a bag or container. Put it in the trash. NOTE: This sheet is a summary. It may not cover all possible information. If you have questions about this medicine, talk to your doctor, pharmacist, or health care provider.  2024 Elsevier/Gold Standard (2022-06-23 00:00:00)

## 2023-12-07 ENCOUNTER — Encounter: Payer: Self-pay | Admitting: Emergency Medicine

## 2023-12-07 ENCOUNTER — Emergency Department
Admission: EM | Admit: 2023-12-07 | Discharge: 2023-12-07 | Disposition: A | Discharge status: Home / Self Care | Payer: Medicaid Other | Attending: Emergency Medicine | Admitting: Emergency Medicine

## 2023-12-07 ENCOUNTER — Other Ambulatory Visit: Payer: Self-pay

## 2023-12-07 ENCOUNTER — Other Ambulatory Visit
Admission: RE | Admit: 2023-12-07 | Discharge: 2023-12-07 | Disposition: A | Discharge status: Home / Self Care | Payer: Medicaid Other | Attending: Psychiatry | Admitting: Psychiatry

## 2023-12-07 ENCOUNTER — Emergency Department: Payer: Medicaid Other

## 2023-12-07 DIAGNOSIS — O26891 Other specified pregnancy related conditions, first trimester: Secondary | ICD-10-CM | POA: Diagnosis present

## 2023-12-07 DIAGNOSIS — F1220 Cannabis dependence, uncomplicated: Secondary | ICD-10-CM | POA: Diagnosis present

## 2023-12-07 DIAGNOSIS — Z3A01 Less than 8 weeks gestation of pregnancy: Secondary | ICD-10-CM | POA: Insufficient documentation

## 2023-12-07 DIAGNOSIS — Z79899 Other long term (current) drug therapy: Secondary | ICD-10-CM | POA: Insufficient documentation

## 2023-12-07 DIAGNOSIS — O418X10 Other specified disorders of amniotic fluid and membranes, first trimester, not applicable or unspecified: Secondary | ICD-10-CM | POA: Insufficient documentation

## 2023-12-07 DIAGNOSIS — O209 Hemorrhage in early pregnancy, unspecified: Secondary | ICD-10-CM

## 2023-12-07 DIAGNOSIS — O208 Other hemorrhage in early pregnancy: Secondary | ICD-10-CM

## 2023-12-07 DIAGNOSIS — F319 Bipolar disorder, unspecified: Secondary | ICD-10-CM | POA: Insufficient documentation

## 2023-12-07 DIAGNOSIS — F4310 Post-traumatic stress disorder, unspecified: Secondary | ICD-10-CM | POA: Diagnosis present

## 2023-12-07 LAB — COMPREHENSIVE METABOLIC PANEL
ALT: 16 U/L (ref 0–44)
AST: 18 U/L (ref 15–41)
Albumin: 4.4 g/dL (ref 3.5–5.0)
Alkaline Phosphatase: 54 U/L (ref 38–126)
Anion gap: 5 (ref 5–15)
BUN: 11 mg/dL (ref 6–20)
CO2: 25 mmol/L (ref 22–32)
Calcium: 9.2 mg/dL (ref 8.9–10.3)
Chloride: 104 mmol/L (ref 98–111)
Creatinine, Ser: 0.6 mg/dL (ref 0.44–1.00)
GFR, Estimated: >60 mL/min (ref >60)
Glucose, Bld: 106 mg/dL — ABNORMAL HIGH (ref 70–99)
Potassium: 3.5 mmol/L (ref 3.5–5.1)
Sodium: 134 mmol/L — ABNORMAL LOW (ref 135–145)
Total Bilirubin: 0.9 mg/dL (ref <1.2)
Total Protein: 7.5 g/dL (ref 6.5–8.1)

## 2023-12-07 LAB — URINALYSIS, ROUTINE W REFLEX MICROSCOPIC
Bacteria, UA: NONE SEEN
Bilirubin Urine: NEGATIVE
Glucose, UA: NEGATIVE mg/dL
Hgb urine dipstick: NEGATIVE
Ketones, ur: NEGATIVE mg/dL
Leukocytes,Ua: NEGATIVE
Nitrite: NEGATIVE
Protein, ur: NEGATIVE mg/dL
Specific Gravity, Urine: 1.018 (ref 1.005–1.030)
pH: 5 (ref 5.0–8.0)

## 2023-12-07 LAB — TSH: TSH: 0.786 u[IU]/mL (ref 0.350–4.500)

## 2023-12-07 LAB — CBC
HCT: 38.2 % (ref 36.0–46.0)
Hemoglobin: 13.3 g/dL (ref 12.0–15.0)
MCH: 32.1 pg (ref 26.0–34.0)
MCHC: 34.8 g/dL (ref 30.0–36.0)
MCV: 92.3 fL (ref 80.0–100.0)
Platelets: 282 10*3/uL (ref 150–400)
RBC: 4.14 MIL/uL (ref 3.87–5.11)
RDW: 13 % (ref 11.5–15.5)
WBC: 10.5 10*3/uL (ref 4.0–10.5)
nRBC: 0 % (ref 0.0–0.2)

## 2023-12-07 LAB — POC URINE PREG, ED: Preg Test, Ur: POSITIVE — AB

## 2023-12-07 LAB — LIPASE, BLOOD: Lipase: 24 U/L (ref 11–51)

## 2023-12-07 LAB — HCG, QUANTITATIVE, PREGNANCY: hCG, Beta Chain, Quant, S: 14948 m[IU]/mL — ABNORMAL HIGH (ref <5)

## 2023-12-07 NOTE — ED Provider Notes (Signed)
[facility] Provider Note    Event Date/Time   First MD Initiated Contact with Patient 12/07/23 1804     (approximate)   History   Abdominal Pain   HPI  [name] is a 37 y.o. female with history of right sided ectopic pregnancy with ovary removal who comes in with concerns for pregnancy test that is positive and left lower quadrant pain.  She reports that she is on the Depo shot.  She states she is not sure when she last had it maybe in September and she is not sure if she was maybe due for it.  She is been sexually active with 1 partner.  She has had STD screening that is all been negative and denies any new partners.  Denies any urinary symptoms.  She reports some intermittent spotting, left lower quadrant pain.  hCG level today was 15,000.  She reports 2 prior pregnancy where she had a abortion and in the 1 ectopic   Physical Exam   Triage Vital Signs: ED Triage Vitals  Encounter Vitals Group     BP 12/07/23 1638 (!) 124/97     Systolic BP Percentile --      Diastolic BP Percentile --      Pulse Rate 12/07/23 1638 (!) 55     Resp 12/07/23 1638 18     Temp 12/07/23 1638 98.2 F (36.8 C)     Temp Source 12/07/23 1638 Oral     SpO2 12/07/23 1638 100 %     Weight 12/07/23 1637 150 lb (68 kg)     Height 12/07/23 1637 5\' 1"  (1.549 m)     Head Circumference --      Peak Flow --      Pain Score 12/07/23 1637 6     Pain Loc --      Pain Education --      Exclude from Growth Chart --     Most recent vital signs: Vitals:   12/07/23 1638  BP: (!) 124/97  Pulse: (!) 55  Resp: 18  Temp: 98.2 F (36.8 C)  SpO2: 100%     General: Awake, no distress.  CV:  Good peripheral perfusion.  Resp:  Normal effort.  Abd:  No distention.  Slight tenderness to the left lower quadrant. Other:     ED Results / Procedures / Treatments   Labs (all labs ordered are listed, but only abnormal results are displayed) Labs Reviewed  COMPREHENSIVE  METABOLIC PANEL - Abnormal; Notable for the following components:      Result Value   Sodium 134 (*)    Glucose, Bld 106 (*)    All other components within normal limits  URINALYSIS, ROUTINE W REFLEX MICROSCOPIC - Abnormal; Notable for the following components:   Color, Urine YELLOW (*)    APPearance CLEAR (*)    All other components within normal limits  POC URINE PREG, ED - Abnormal; Notable for the following components:   Preg Test, Ur Positive (*)    All other components within normal limits  LIPASE, BLOOD  CBC  HCG, QUANTITATIVE, PREGNANCY      RADIOLOGY I have reviewed the ultrasound personally interpreted and +  intrauterine sac    PROCEDURES:  Critical Care performed: No  Procedures   MEDICATIONS ORDERED IN ED: Medications - No data to display   IMPRESSION / MDM / ASSESSMENT AND PLAN / ED COURSE  I reviewed the triage vital signs and the nursing notes.  Patient's presentation is most consistent with acute presentation with potential threat to life or bodily function.   Differential includes pregnancy, ectopic, miscarriage.  I reviewed patient's blood work from 2 years ago where she is O+ antibody screen negative  Pricey test today was elevated.  Lipase normal.  CMP slightly low sodium CBC reassuring urine without signs of UTI.  hCG level was 14,948 done this morning at 9:40 AM.  IMPRESSION: 1. Early intrauterine gestational sac with internal yolk sac, but no fetal pole or cardiac activity yet visualized. Mean sac diameter measures 9.2 mm. Recommend follow-up quantitative B-HCG levels and follow-up US in 10-14 days to confirm and assess viability. 2. Moderate sized subchorionic hemorrhage as above. 3. 2.3 cm left ovarian cyst, likely a corpus luteal cyst.   We discussed her [place] as above.  Patient reports that she does not really want to have any additional kids but she was not planning on having an abortion.  She understands that this is unclear if this  will come to term or not and that she needs to follow-up for repeat hCG, ultrasound in 10 to 14 days.  We also discussed the subchorionic hemorrhage.  She expressed understanding and felt comfortable with the above and will follow-up outpatient for repeat testing.  She already has an OB she is going to follow-up with   Repeat abdominal exam soft and non tender and no significant bleeding at this time. Decline STD testing.     FINAL CLINICAL IMPRESSION(S) / ED DIAGNOSES   Final diagnoses:  Vaginal bleeding affecting early pregnancy  Subchorionic hemorrhage in first trimester     Rx / DC Orders   ED Discharge Orders     None        Note:  This document was prepared using Dragon voice recognition software and may include unintentional dictation errors.   Concha Se, MD 12/07/23 [phone]

## 2023-12-07 NOTE — ED Notes (Signed)
See triage note  Presents with some abd discomfort for about 1 week  States then she noticed some vaginal spotting  States she is about 4-[redacted] weeks pregnant  Has had an ectopic in past   feels the same

## 2023-12-07 NOTE — Discharge Instructions (Addendum)
It is too early to know if this will come to term or not see need to follow-up with OB/GYN in 10 to 14 days for repeat hCG, ultrasound.  You return to the ER if you develop worsening bleeding more than a pad an hour, fevers, worsening pain or any other concerns   IMPRESSION: 1. Early intrauterine gestational sac with internal yolk sac, but no fetal pole or cardiac activity yet visualized. Mean sac diameter measures 9.2 mm. Recommend follow-up quantitative B-HCG levels and follow-up US in 10-14 days to confirm and assess viability. 2. Moderate sized subchorionic hemorrhage as above. 3. 2.3 cm left ovarian cyst, likely a corpus luteal cyst.

## 2023-12-07 NOTE — ED Triage Notes (Addendum)
Patient to ED via POV for abd pain. States ongoing x1 week. States approx [redacted] weeks pregnant. Hx of ectopic pregnancy. States she has had some spotting.

## 2023-12-10 ENCOUNTER — Other Ambulatory Visit: Payer: Medicaid Other

## 2023-12-10 DIAGNOSIS — O209 Hemorrhage in early pregnancy, unspecified: Secondary | ICD-10-CM

## 2023-12-11 LAB — BETA HCG QUANT (REF LAB): hCG Quant: 20778 m[IU]/mL

## 2023-12-13 LAB — PANEL 799049
CARBOXY THC GC/MS CONF: 584 ng/mL
Cannabinoid GC/MS, Ur: POSITIVE — AB

## 2023-12-13 LAB — URINE DRUGS OF ABUSE SCREEN W ALC, ROUTINE (REF LAB)
Amphetamines, Urine: NEGATIVE ng/mL
Barbiturate, Ur: NEGATIVE ng/mL
Benzodiazepine Quant, Ur: NEGATIVE ng/mL
Cocaine (Metab.): NEGATIVE ng/mL
Ethanol U, Quan: NEGATIVE %
Methadone Screen, Urine: NEGATIVE ng/mL
Opiate Quant, Ur: NEGATIVE ng/mL
Phencyclidine, Ur: NEGATIVE ng/mL
Propoxyphene, Urine: NEGATIVE ng/mL

## 2023-12-14 ENCOUNTER — Other Ambulatory Visit: Payer: Self-pay

## 2023-12-14 DIAGNOSIS — O3680X0 Pregnancy with inconclusive fetal viability, not applicable or unspecified: Secondary | ICD-10-CM

## 2023-12-15 ENCOUNTER — Other Ambulatory Visit: Payer: Medicaid Other

## 2023-12-15 ENCOUNTER — Telehealth: Payer: Self-pay

## 2023-12-15 NOTE — Telephone Encounter (Signed)
Reached out to pt to reschedule lab appt that was scheduled on 12/15/2023 at 9:00.  Left message for pt to call back.

## 2023-12-16 NOTE — Telephone Encounter (Signed)
Reached out to pt (2x) to reschedule lab appt that was scheduled on 12/15/2023 at 9:00.Spoke with pt.  Pt is going to keep the previously made appt for 12/0/2024 for labs.  Advised pt that she would need to go to the hospital for the next draw on 11/29/2023.  Sent message for [place] to correct/make the order.

## 2023-12-17 ENCOUNTER — Ambulatory Visit (INDEPENDENT_AMBULATORY_CARE_PROVIDER_SITE_OTHER): Payer: Medicaid Other | Admitting: Professional Counselor

## 2023-12-17 ENCOUNTER — Other Ambulatory Visit: Payer: Medicaid Other

## 2023-12-17 DIAGNOSIS — F1220 Cannabis dependence, uncomplicated: Secondary | ICD-10-CM | POA: Diagnosis not present

## 2023-12-17 DIAGNOSIS — O3680X0 Pregnancy with inconclusive fetal viability, not applicable or unspecified: Secondary | ICD-10-CM

## 2023-12-17 DIAGNOSIS — F4310 Post-traumatic stress disorder, unspecified: Secondary | ICD-10-CM | POA: Diagnosis not present

## 2023-12-17 DIAGNOSIS — F310 Bipolar disorder, current episode hypomanic: Secondary | ICD-10-CM

## 2023-12-17 DIAGNOSIS — F319 Bipolar disorder, unspecified: Secondary | ICD-10-CM
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
## 2023-12-18 LAB — BETA HCG QUANT (REF LAB): hCG Quant: 64445 m[IU]/mL

## 2023-12-23 ENCOUNTER — Telehealth: Payer: Medicaid Other

## 2023-12-23 VITALS — Wt 145.0 lb

## 2023-12-23 DIAGNOSIS — Z3480 Encounter for supervision of other normal pregnancy, unspecified trimester: Secondary | ICD-10-CM | POA: Insufficient documentation

## 2023-12-23 DIAGNOSIS — Z3689 Encounter for other specified antenatal screening: Secondary | ICD-10-CM | POA: Diagnosis not present

## 2023-12-23 NOTE — Progress Notes (Signed)
New OB Intake  I connected with  [name] on 12/23/23 at  8:15 AM EST by Video Visit and verified that I am speaking with the correct person using two identifiers. Nurse is located at [facility] and pt is located at home.  I explained I am completing New OB Intake today. We discussed her EDD of 08/07/2024 that is based on LMP of 11/01/2023. Pt is G10/P4146. I reviewed her allergies, medications, Medical/Surgical/OB history, and appropriate screenings. There are no cats in the home.   Based on history, this is a/an pregnancy uncomplicated . Her obstetrical history is significant for advanced maternal age and multiple abortions .  Patient Active Problem List   Diagnosis Date Noted   Supervision of other normal pregnancy, antepartum 12/23/2023   PTSD (post-traumatic stress disorder) 12/04/2023   Bipolar and related disorder (HCC) 12/04/2023   Cannabis use disorder, moderate, dependence (HCC) 12/04/2023   High risk medication use 12/04/2023    Concerns addressed today Nausea; adv per new protocol.  Delivery Plans:  Plans to deliver at [facility].  Anatomy US Explained first scheduled [place] will be Jan 09th and an anatomy scan will be done at 20 weeks.  Labs Discussed  genetic screening with patient. Patient desires genetic testing to be drawn at new OB visit. Discussed possible labs to be drawn at new OB appointment.  COVID Vaccine Patient has not had COVID vaccine.   Social Determinants of Health Food Insecurity: expresses food insecurity. Information given on local food banks. Transportation: Patient denies transportation needs. Childcare: Discussed no children allowed at ultrasound appointments.   First visit review I reviewed new OB appt with pt. I explained she will have ob bloodwork and pap smear/pelvic exam if indicated. Explained pt will be seen by an AOB Provider at first visit; encounter routed to appropriate provider.   Loran Senters,  [facility] 12/23/2023  12:03 PM

## 2023-12-23 NOTE — Patient Instructions (Signed)
First Trimester of Pregnancy  The first trimester of pregnancy starts on the first day of your last menstrual period until the end of week 12. This is also called months 1 through 3 of pregnancy. Body changes during your first trimester Your body goes through many changes during pregnancy. The changes usually return to normal after your baby is born. Physical changes You may gain or lose weight. Your breasts may grow larger and hurt. The area around your nipples may get darker. Dark spots or blotches may develop on your face. You may have changes in your hair. Health changes You may feel like you might vomit (nauseous), and you may vomit. You may have heartburn. You may have headaches. You may have trouble pooping (constipation). Your gums may bleed. Other changes You may get tired easily. You may pee (urinate) more often. Your menstrual periods will stop. You may not feel hungry. You may want to eat certain kinds of food. You may have changes in your emotions from day to day. You may have more dreams. Follow these instructions at home: Medicines Take over-the-counter and prescription medicines only as told by your doctor. Some medicines are not safe during pregnancy. Take a prenatal vitamin that contains at least 600 micrograms (mcg) of folic acid. Eating and drinking Eat healthy meals that include: Fresh fruits and vegetables. Whole grains. Good sources of protein, such as meat, eggs, or tofu. Low-fat dairy products. Avoid raw meat and unpasteurized juice, milk, and cheese. If you feel like you may vomit, or you vomit: Eat 4 or 5 small meals a day instead of 3 large meals. Try eating a few soda crackers. Drink liquids between meals instead of during meals. You may need to take these actions to prevent or treat trouble pooping: Drink enough fluids to keep your pee (urine) pale yellow. Eat foods that are high in fiber. These include beans, whole grains, and fresh fruits and  vegetables. Limit foods that are high in fat and sugar. These include fried or sweet foods. Activity Exercise only as told by your doctor. Most people can do their usual exercise routine during pregnancy. Stop exercising if you have cramps or pain in your lower belly (abdomen) or low back. Do not exercise if it is too hot or too humid, or if you are in a place of great height (high altitude). Avoid heavy lifting. If you choose to, you may have sex unless your doctor tells you not to. Relieving pain and discomfort Wear a good support bra if your breasts are sore. Rest with your legs raised (elevated) if you have leg cramps or low back pain. If you have bulging veins (varicose veins) in your legs: Wear support hose as told by your doctor. Raise your feet for 15 minutes, 3-4 times a day. Limit salt in your food. Safety Wear your seat belt at all times when you are in a car. Talk with your doctor if someone is hurting you or yelling at you. Talk with your doctor if you are feeling sad or have thoughts of hurting yourself. Lifestyle Do not use hot tubs, steam rooms, or saunas. Do not douche. Do not use tampons or scented sanitary pads. Do not use herbal medicines, illegal drugs, or medicines that are not approved by your doctor. Do not drink alcohol. Do not smoke or use any products that contain nicotine or tobacco. If you need help quitting, ask your doctor. Avoid cat litter boxes and soil that is used by cats. These carry   germs that can cause harm to the baby and can cause a loss of your baby by miscarriage or stillbirth. General instructions Keep all follow-up visits. This is important. Ask for help if you need counseling or if you need help with nutrition. Your doctor can give you advice or tell you where to go for help. Visit your dentist. At home, brush your teeth with a soft toothbrush. Floss gently. Write down your questions. Take them to your prenatal visits. Where to find more  information American Pregnancy Association: americanpregnancy.org American College of Obstetricians and Gynecologists: www.acog.org Office on Women's Health: womenshealth.gov/pregnancy Contact a doctor if: You are dizzy. You have a fever. You have mild cramps or pressure in your lower belly. You have a nagging pain in your belly area. You continue to feel like you may vomit, you vomit, or you have watery poop (diarrhea) for 24 hours or longer. You have a bad-smelling fluid coming from your vagina. You have pain when you pee. You are exposed to a disease that spreads from person to person, such as chickenpox, measles, Zika virus, HIV, or hepatitis. Get help right away if: You have spotting or bleeding from your vagina. You have very bad belly cramping or pain. You have shortness of breath or chest pain. You have any kind of injury, such as from a fall or a car crash. You have new or increased pain, swelling, or redness in an arm or leg. Summary The first trimester of pregnancy starts on the first day of your last menstrual period until the end of week 12 (months 1 through 3). Eat 4 or 5 small meals a day instead of 3 large meals. Do not smoke or use any products that contain nicotine or tobacco. If you need help quitting, ask your doctor. Keep all follow-up visits. This information is not intended to replace advice given to you by your health care provider. Make sure you discuss any questions you have with your health care provider. Document Revised: 05/22/2020 Document Reviewed: 03/28/2020 Elsevier Patient Education  2024 [employer] Commonly Asked Questions During Pregnancy  Cats: A parasite can be excreted in cat feces.  To avoid exposure you need to have another person empty the little box.  If you must empty the litter box you will need to wear gloves.  Wash your hands after handling your cat.  This parasite can also be found in raw or undercooked meat so this should also be  avoided.  Colds, Sore Throats, Flu: Please check your medication sheet to see what you can take for symptoms.  If your symptoms are unrelieved by these medications please call the office.  Dental Work: Most any dental work your dentist recommends is permitted.  X-rays should only be taken during the first trimester if absolutely necessary.  Your abdomen should be shielded with a lead apron during all x-rays.  Please notify your provider prior to receiving any x-rays.  Novocaine is fine; gas is not recommended.  If your dentist requires a note from us prior to dental work please call the office and we will provide one for you.  Exercise: Exercise is an important part of staying healthy during your pregnancy.  You may continue most exercises you were accustomed to prior to pregnancy.  Later in your pregnancy you will most likely notice you have difficulty with activities requiring balance like riding a bicycle.  It is important that you listen to your body and avoid activities that put you at a higher   risk of falling.  Adequate rest and staying well hydrated are a must!  If you have questions about the safety of specific activities ask your provider.    Exposure to Children with illness: Try to avoid obvious exposure; report any symptoms to us when noted,  If you have chicken pos, red measles or mumps, you should be immune to these diseases.   Please do not take any vaccines while pregnant unless you have checked with your OB provider.  Fetal Movement: After 28 weeks we recommend you do "kick counts" twice daily.  Lie or sit down in a calm quiet environment and count your baby movements "kicks".  You should feel your baby at least 10 times per hour.  If you have not felt 10 kicks within the first hour get up, walk around and have something sweet to eat or drink then repeat for an additional hour.  If count remains less than 10 per hour notify your provider.  Fumigating: Follow your pest control agent's  advice as to how long to stay out of your home.  Ventilate the area well before re-entering.  Hemorrhoids:   Most over-the-counter preparations can be used during pregnancy.  Check your medication to see what is safe to use.  It is important to use a stool softener or fiber in your diet and to drink lots of liquids.  If hemorrhoids seem to be getting worse please call the office.   Hot Tubs:  Hot tubs Jacuzzis and saunas are not recommended while pregnant.  These increase your internal body temperature and should be avoided.  Intercourse:  Sexual intercourse is safe during pregnancy as long as you are comfortable, unless otherwise advised by your provider.  Spotting may occur after intercourse; report any bright red bleeding that is heavier than spotting.  Labor:  If you know that you are in labor, please go to the hospital.  If you are unsure, please call the office and let us help you decide what to do.  Lifting, straining, etc:  If your job requires heavy lifting or straining please check with your provider for any limitations.  Generally, you should not lift items heavier than that you can lift simply with your hands and arms (no back muscles)  Painting:  Paint fumes do not harm your pregnancy, but may make you ill and should be avoided if possible.  Latex or water based paints have less odor than oils.  Use adequate ventilation while painting.  Permanents & Hair Color:  Chemicals in hair dyes are not recommended as they cause increase hair dryness which can increase hair loss during pregnancy.  " Highlighting" and permanents are allowed.  Dye may be absorbed differently and permanents may not hold as well during pregnancy.  Sunbathing:  Use a sunscreen, as skin burns easily during pregnancy.  Drink plenty of fluids; avoid over heating.  Tanning Beds:  Because their possible side effects are still unknown, tanning beds are not recommended.  Ultrasound Scans:  Routine ultrasounds are performed  at approximately 20 weeks.  You will be able to see your baby's general anatomy an if you would like to know the gender this can usually be determined as well.  If it is questionable when you conceived you may also receive an ultrasound early in your pregnancy for dating purposes.  Otherwise ultrasound exams are not routinely performed unless there is a medical necessity.  Although you can request a scan we ask that you pay for it when   conducted because insurance does not cover " patient request" scans.  Work: If your pregnancy proceeds without complications you may work until your due date, unless your physician or employer advises otherwise.  Round Ligament Pain/Pelvic Discomfort:  Sharp, shooting pains not associated with bleeding are fairly common, usually occurring in the second trimester of pregnancy.  They tend to be worse when standing up or when you remain standing for long periods of time.  These are the result of pressure of certain pelvic ligaments called "round ligaments".  Rest, Tylenol and heat seem to be the most effective relief.  As the womb and fetus grow, they rise out of the pelvis and the discomfort improves.  Please notify the office if your pain seems different than that described.  It may represent a more serious condition.  Common Medications Safe in Pregnancy  Acne:      Constipation:  Benzoyl Peroxide     Colace  Clindamycin      Dulcolax Suppository  Topica Erythromycin     Fibercon  Salicylic Acid      Metamucil         Miralax AVOID:        Senakot   Accutane    Cough:  Retin-A       Cough Drops  Tetracycline      Phenergan w/ Codeine if Rx  Minocycline      Robitussin (Plain & DM)  Antibiotics:     Crabs/Lice:  Ceclor       RID  Cephalosporins    AVOID:  E-Mycins      Kwell  Keflex  Macrobid/Macrodantin   Diarrhea:  Penicillin      Kao-Pectate  Zithromax      Imodium AD         PUSH FLUIDS AVOID:       Cipro     Fever:  Tetracycline      Tylenol (Regular  or Extra  Minocycline       Strength)  Levaquin      Extra Strength-Do not          Exceed 8 tabs/24 hrs Caffeine:        <200mg/day (equiv. To 1 cup of coffee or  approx. 3 12 oz sodas)         Gas: Cold/Hayfever:       Gas-X  Benadryl      Mylicon  Claritin       Phazyme  **Claritin-D        Chlor-Trimeton    Headaches:  Dimetapp      ASA-Free Excedrin  Drixoral-Non-Drowsy     Cold Compress  Mucinex (Guaifenasin)     Tylenol (Regular or Extra  Sudafed/Sudafed-12 Hour     Strength)  **Sudafed PE Pseudoephedrine   Tylenol Cold & Sinus     Vicks Vapor Rub  Zyrtec  **AVOID if Problems With Blood Pressure         Heartburn: Avoid lying down for at least 1 hour after meals  Aciphex      Maalox     Rash:  Milk of Magnesia     Benadryl    Mylanta       1% Hydrocortisone Cream  Pepcid  Pepcid Complete   Sleep Aids:  Prevacid      Ambien   Prilosec       Benadryl  Rolaids       Chamomile Tea  Tums (Limit 4/day)     Unisom           Tylenol PM         Warm milk-add vanilla or  Hemorrhoids:       Sugar for taste  Anusol/Anusol H.C.  (RX: Analapram 2.5%)  Sugar Substitutes:  Hydrocortisone OTC     Ok in moderation  Preparation H      Tucks        Vaseline lotion applied to tissue with wiping    Herpes:     Throat:  Acyclovir      Oragel  Famvir  Valtrex     Vaccines:         Flu Shot Leg Cramps:       *Gardasil  Benadryl      Hepatitis A         Hepatitis B Nasal Spray:       Pneumovax  Saline Nasal Spray     Polio Booster         Tetanus Nausea:       Tuberculosis test or PPD  Vitamin B6 25 mg TID   AVOID:    Dramamine      *Gardasil  Emetrol       Live Poliovirus  Ginger Root 250 mg QID    MMR (measles, mumps &  High Complex Carbs @ Bedtime    rebella)  Sea Bands-Accupressure    Varicella (Chickenpox)  Unisom 1/2 tab TID     *No known complications           If received before Pain:         Known pregnancy;   Darvocet       Resume series  after  Lortab        Delivery  Percocet    Yeast:   Tramadol      Femstat  Tylenol 3      Gyne-lotrimin  Ultram       Monistat  Vicodin           MISC:         All Sunscreens           Hair Coloring/highlights          Insect Repellant's          (Including DEET)         Mystic Tans  

## 2023-12-28 ENCOUNTER — Emergency Department
Admission: EM | Admit: 2023-12-28 | Discharge: 2023-12-28 | Discharge status: Against Medical Advice | Payer: Medicaid Other | Attending: Student | Admitting: Student

## 2023-12-28 ENCOUNTER — Emergency Department: Payer: Medicaid Other

## 2023-12-28 DIAGNOSIS — R197 Diarrhea, unspecified: Secondary | ICD-10-CM | POA: Insufficient documentation

## 2023-12-28 DIAGNOSIS — Z5321 Procedure and treatment not carried out due to patient leaving prior to being seen by health care provider: Secondary | ICD-10-CM | POA: Diagnosis not present

## 2023-12-28 DIAGNOSIS — Z3A08 8 weeks gestation of pregnancy: Secondary | ICD-10-CM | POA: Insufficient documentation

## 2023-12-28 DIAGNOSIS — H538 Other visual disturbances: Secondary | ICD-10-CM | POA: Diagnosis not present

## 2023-12-28 DIAGNOSIS — O219 Vomiting of pregnancy, unspecified: Secondary | ICD-10-CM | POA: Diagnosis present

## 2023-12-28 LAB — CBC WITH DIFFERENTIAL/PLATELET
Abs Immature Granulocytes: 0.1 10*3/uL — ABNORMAL HIGH (ref 0.00–0.07)
Basophils Absolute: 0.1 10*3/uL (ref 0.0–0.1)
Basophils Relative: 1 %
Eosinophils Absolute: 0.1 10*3/uL (ref 0.0–0.5)
Eosinophils Relative: 1 %
HCT: 36.1 % (ref 36.0–46.0)
Hemoglobin: 12.5 g/dL (ref 12.0–15.0)
Immature Granulocytes: 1 %
Lymphocytes Relative: 12 %
Lymphs Abs: 1.5 10*3/uL (ref 0.7–4.0)
MCH: 32.3 pg (ref 26.0–34.0)
MCHC: 34.6 g/dL (ref 30.0–36.0)
MCV: 93.3 fL (ref 80.0–100.0)
Monocytes Absolute: 1 10*3/uL (ref 0.1–1.0)
Monocytes Relative: 8 %
Neutro Abs: 10 10*3/uL — ABNORMAL HIGH (ref 1.7–7.7)
Neutrophils Relative %: 77 %
Platelets: 283 10*3/uL (ref 150–400)
RBC: 3.87 MIL/uL (ref 3.87–5.11)
RDW: 12.9 % (ref 11.5–15.5)
WBC: 12.7 10*3/uL — ABNORMAL HIGH (ref 4.0–10.5)
nRBC: 0 % (ref 0.0–0.2)

## 2023-12-28 LAB — URINALYSIS, ROUTINE W REFLEX MICROSCOPIC
Bilirubin Urine: NEGATIVE
Glucose, UA: NEGATIVE mg/dL
Hgb urine dipstick: NEGATIVE
Ketones, ur: NEGATIVE mg/dL
Leukocytes,Ua: NEGATIVE
Nitrite: NEGATIVE
Protein, ur: NEGATIVE mg/dL
Specific Gravity, Urine: 1.017 (ref 1.005–1.030)
pH: 6 (ref 5.0–8.0)

## 2023-12-28 LAB — BASIC METABOLIC PANEL
Anion gap: 11 (ref 5–15)
BUN: 11 mg/dL (ref 6–20)
CO2: 22 mmol/L (ref 22–32)
Calcium: 9.1 mg/dL (ref 8.9–10.3)
Chloride: 101 mmol/L (ref 98–111)
Creatinine, Ser: 0.66 mg/dL (ref 0.44–1.00)
GFR, Estimated: >60 mL/min (ref >60)
Glucose, Bld: 99 mg/dL (ref 70–99)
Potassium: 3.7 mmol/L (ref 3.5–5.1)
Sodium: 134 mmol/L — ABNORMAL LOW (ref 135–145)

## 2023-12-28 LAB — HCG, QUANTITATIVE, PREGNANCY: hCG, Beta Chain, Quant, S: 132169 m[IU]/mL — ABNORMAL HIGH (ref <5)

## 2023-12-28 MED ORDER — ACETAMINOPHEN 325 MG PO TABS
650.0000 mg | ORAL_TABLET | Freq: Once | ORAL | Status: DC
  Filled 2023-12-28: qty 2

## 2023-12-28 NOTE — ED Provider Triage Note (Signed)
 Emergency Medicine Provider Triage Evaluation Note  [name] , a 37 y.o. female [name] was evaluated in triage.  Pt complains of blurry vision, n/v/d. +lightheadedness. Currently [redacted] weeks pregnant by LMP.   Review of Systems  Positive: Pelvic cramping, n/v/d Negative: fever  Physical Exam  LMP 11/01/2023 (Exact Date)  Gen:   Awake, no distress   Resp:  Normal effort  MSK:   Moves extremities without difficulty  Other:    Medical Decision Making  Medically screening exam initiated at 12:57 PM.  Appropriate orders placed.  [name] was informed that the remainder of the evaluation will be completed by another provider, this initial triage assessment does not replace that evaluation, and the importance of remaining in the ED until their evaluation is complete.     [name] E, PA-C 12/28/23 1259

## 2023-12-28 NOTE — ED Triage Notes (Signed)
Pt 8 weeks pregnancy and has been having diarrhea, blurry vision, worse than a period cramps that last for about 3 sec, and 1x episode of vomiting.

## 2023-12-29 NOTE — L&D Delivery Note (Signed)
 Delivery Note At 3:31 AM a viable female was delivered via Vaginal, Spontaneous (Presentation:vtx   Occiput Anterior).  APGAR: 8, 9; weight 6 lb 3.1 oz (2810 g).   Placenta status: Spontaneous, Intact.  Cord: 3 vessels with the following complications: None.  Cord pH: n/a Meconium noted ( thin ) . Delivery of head and shoulders uncomplicated . Delayed cord clamping . Vigorous female . Placenta intact 5 min later . Good hemostasis . She did receive both IV pitocin  and IM methergine  0.2 mg  Anesthesia: None Episiotomy: None Lacerations: None Suture Repair: n/a Est. Blood Loss (mL): 125  Mom to postpartum.  Baby to Couplet care / Skin to Skin.  [name] [name] 07/25/2024, 4:13 AM

## 2023-12-31 ENCOUNTER — Other Ambulatory Visit (HOSPITAL_COMMUNITY): Payer: Self-pay | Admitting: Psychiatry

## 2023-12-31 DIAGNOSIS — F319 Bipolar disorder, unspecified: Secondary | ICD-10-CM

## 2023-12-31 DIAGNOSIS — F4310 Post-traumatic stress disorder, unspecified: Secondary | ICD-10-CM

## 2024-01-06 ENCOUNTER — Ambulatory Visit (INDEPENDENT_AMBULATORY_CARE_PROVIDER_SITE_OTHER): Payer: Medicaid Other

## 2024-01-06 DIAGNOSIS — O3680X0 Pregnancy with inconclusive fetal viability, not applicable or unspecified: Secondary | ICD-10-CM

## 2024-01-06 DIAGNOSIS — Z3687 Encounter for antenatal screening for uncertain dates: Secondary | ICD-10-CM

## 2024-01-06 DIAGNOSIS — Z3A09 9 weeks gestation of pregnancy: Secondary | ICD-10-CM

## 2024-01-10 ENCOUNTER — Ambulatory Visit: Payer: Self-pay | Admitting: Psychiatry

## 2024-01-14 ENCOUNTER — Ambulatory Visit (INDEPENDENT_AMBULATORY_CARE_PROVIDER_SITE_OTHER): Payer: Medicaid Other | Admitting: Professional Counselor

## 2024-01-14 DIAGNOSIS — Z91199 Patient's noncompliance with other medical treatment and regimen due to unspecified reason: Secondary | ICD-10-CM

## 2024-01-14 NOTE — Progress Notes (Signed)
Patient no-showed today's appointment; appointment was for 01/14/24 at 8 AM for outpatient therapy.

## 2024-01-18 ENCOUNTER — Ambulatory Visit (INDEPENDENT_AMBULATORY_CARE_PROVIDER_SITE_OTHER): Payer: Medicaid Other | Admitting: Professional Counselor

## 2024-01-18 DIAGNOSIS — F319 Bipolar disorder, unspecified: Secondary | ICD-10-CM | POA: Diagnosis not present

## 2024-01-18 NOTE — Progress Notes (Signed)
   THERAPIST PROGRESS NOTE  Session Time: 9:00 AM - 9:58 AM  Participation Level: Active  Behavioral Response: Casual, Alert, Dysphoric  Type of Therapy: Individual Therapy  Treatment Goals addressed: Active BH CCP BIPOLAR DISORDER-MANIA/HYPOMANIA   LTG: "When I start seeing me be stable at a job. Like a healthy relationship with my kids. I don't have to yell at them. Just parenting skills, being more patient with them. There's days I won't shower or brush my teeth. So just regulating my emotions.    Start:  01/18/24    Expected End:  01/16/25     STG: To improve relationship satisfaction AEB identifying healthy factors within a relationship and setting healthy boundaries over the next 12 weeks.    STG: Will work on achieving personal goals with boxing to build mastery and sense of self.   STG: Brieonna will identify coping strategies to deal with trauma memories and the associated emotional reaction   ProgressTowards Goals: Initial  Interventions: CBT and Motivational Interviewing  Summary: Ayahna Kary is a 38 y.o. female who presents with PTSD, bipolar disorder, and hx of cannabis use. Doneta appeared alert, dysphoric, but oriented x5. She was tearful throughout session. She noted she quit the job she was doing because morning sickness was impacting her ability to work. Richardine still wants to relocate to [place]. She engaged in discussion identifying pros/cons to moving sooner and steps necessary to prepare for a move. She engaged in developing her treatment plan. Sherine discussed some of her thinking patterns. She is somewhat resistant in changing her thinking in regard to overgeneralizations about minority/immigrant populations. Madalynn was in agreement to scheduled another follow-up appointment.   Therapist Response: Conducted session with Cassey. Began session with check-in/update since previous session. Used empathetic and reflective listening. Used open-ended questions to  facilitate discussion and assisted with identifying necessary steps to take prior to making relocation to [place]. Developed treatment plan with input from Marsella on current strengths, needs, and progress towards goals. Discussed the ability to various types of therapy/treatment and possibility to reduce symptoms and treat PTSD. Used Socratic questioning to help challenge negative thinking and test Ishana's cognitive flexibility. Scheduled next appointment and concluded session.   Suicidal/Homicidal: No  Plan: Return again in 1 week.  Diagnosis: Bipolar and related disorder (HCC)  Collaboration of Care: Medication Management AEB chart review  Patient/Guardian was advised Release of Information must be obtained prior to any record release in order to collaborate their care with an outside provider. Patient/Guardian was advised if they have not already done so to contact the registration department to sign all necessary forms in order for [place] to release information regarding their care.   Consent: Patient/Guardian gives verbal consent for treatment and assignment of benefits for services provided during this visit. Patient/Guardian expressed understanding and agreed to proceed.   Edmonia Lynch, [facility] 01/18/2024

## 2024-01-27 ENCOUNTER — Encounter: Payer: Self-pay | Admitting: Obstetrics and Gynecology

## 2024-01-27 ENCOUNTER — Other Ambulatory Visit (HOSPITAL_COMMUNITY)
Admission: RE | Admit: 2024-01-27 | Discharge: 2024-01-27 | Disposition: A | Discharge status: Home / Self Care | Payer: Medicaid Other | Source: Ambulatory Visit | Attending: Obstetrics and Gynecology | Admitting: Obstetrics and Gynecology

## 2024-01-27 ENCOUNTER — Ambulatory Visit (INDEPENDENT_AMBULATORY_CARE_PROVIDER_SITE_OTHER): Payer: Medicaid Other | Admitting: Obstetrics and Gynecology

## 2024-01-27 ENCOUNTER — Ambulatory Visit (INDEPENDENT_AMBULATORY_CARE_PROVIDER_SITE_OTHER): Payer: Medicaid Other | Admitting: Professional Counselor

## 2024-01-27 VITALS — BP 112/71 | HR 84 | Wt 156.2 lb

## 2024-01-27 DIAGNOSIS — O09521 Supervision of elderly multigravida, first trimester: Secondary | ICD-10-CM

## 2024-01-27 DIAGNOSIS — O09899 Supervision of other high risk pregnancies, unspecified trimester: Secondary | ICD-10-CM

## 2024-01-27 DIAGNOSIS — O99341 Other mental disorders complicating pregnancy, first trimester: Secondary | ICD-10-CM | POA: Diagnosis not present

## 2024-01-27 DIAGNOSIS — F319 Bipolar disorder, unspecified: Secondary | ICD-10-CM

## 2024-01-27 DIAGNOSIS — O09891 Supervision of other high risk pregnancies, first trimester: Secondary | ICD-10-CM | POA: Diagnosis not present

## 2024-01-27 DIAGNOSIS — Z3A00 Weeks of gestation of pregnancy not specified: Secondary | ICD-10-CM | POA: Diagnosis not present

## 2024-01-27 DIAGNOSIS — F1220 Cannabis dependence, uncomplicated: Secondary | ICD-10-CM

## 2024-01-27 DIAGNOSIS — Z0283 Encounter for blood-alcohol and blood-drug test: Secondary | ICD-10-CM

## 2024-01-27 DIAGNOSIS — Z3480 Encounter for supervision of other normal pregnancy, unspecified trimester: Secondary | ICD-10-CM

## 2024-01-27 DIAGNOSIS — F4310 Post-traumatic stress disorder, unspecified: Secondary | ICD-10-CM | POA: Diagnosis not present

## 2024-01-27 DIAGNOSIS — O09299 Supervision of pregnancy with other poor reproductive or obstetric history, unspecified trimester: Secondary | ICD-10-CM

## 2024-01-27 DIAGNOSIS — Z124 Encounter for screening for malignant neoplasm of cervix: Secondary | ICD-10-CM | POA: Diagnosis present

## 2024-01-27 DIAGNOSIS — Z114 Encounter for screening for human immunodeficiency virus [HIV]: Secondary | ICD-10-CM

## 2024-01-27 DIAGNOSIS — Z1159 Encounter for screening for other viral diseases: Secondary | ICD-10-CM

## 2024-01-27 DIAGNOSIS — O99321 Drug use complicating pregnancy, first trimester: Secondary | ICD-10-CM

## 2024-01-27 DIAGNOSIS — Z720 Tobacco use: Secondary | ICD-10-CM

## 2024-01-27 DIAGNOSIS — Z113 Encounter for screening for infections with a predominantly sexual mode of transmission: Secondary | ICD-10-CM | POA: Diagnosis present

## 2024-01-27 DIAGNOSIS — Z79899 Other long term (current) drug therapy: Secondary | ICD-10-CM

## 2024-01-27 DIAGNOSIS — Z1379 Encounter for other screening for genetic and chromosomal anomalies: Secondary | ICD-10-CM

## 2024-01-27 DIAGNOSIS — Z3A12 12 weeks gestation of pregnancy: Secondary | ICD-10-CM

## 2024-01-27 DIAGNOSIS — O34219 Maternal care for unspecified type scar from previous cesarean delivery: Secondary | ICD-10-CM

## 2024-01-27 DIAGNOSIS — Z641 Problems related to multiparity: Secondary | ICD-10-CM
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
## 2024-01-27 NOTE — Progress Notes (Signed)
THERAPIST PROGRESS NOTE  Virtual Visit via Video Note  I connected with [name] on 01/29/24 at  9:00 AM EST by a video enabled telemedicine application and verified that I am speaking with the correct person using two identifiers.  Location: Patient: Home Provider: Office   I discussed the limitations of evaluation and management by telemedicine and the availability of in person appointments. The patient expressed understanding and agreed to proceed.  I discussed the assessment and treatment plan with the patient. The patient was provided an opportunity to ask questions and all were answered. The patient agreed with the plan and demonstrated an understanding of the instructions.   The patient was advised to call back or seek an in-person evaluation if the symptoms worsen or if the condition fails to improve as anticipated.  I provided 54 minutes of non-face-to-face time during this encounter. [name], [facility]  Session Time: 9:00 AM - 9:54 AM   Participation Level: Active  Behavioral Response: Casual, Alert, Anxious  Type of Therapy: Individual Therapy  Treatment Goals addressed: Active BIPOLAR DISORDER-MANIA/HYPOMANIA    LTG: "When I start seeing me be stable at a job. Like a healthy relationship with my kids. I don't have to yell at them. Just parenting skills, being more patient with them. There's days I won't shower or brush my teeth. So just regulating my emotions.                Start:  01/18/24    Expected End:  01/16/25      STG: To improve relationship satisfaction AEB identifying healthy factors within a relationship and setting healthy boundaries over the next 12 weeks.     STG: Will work on achieving personal goals with boxing to build mastery and sense of self.    STG: [name] will identify coping strategies to deal with trauma memories and the associated emotional reaction     ProgressTowards Goals: Progressing  Interventions:  CBT  Summary: [name] is a 38 y.o. female who presents with bipolar disorder and PTSD.  She appeared alert and oriented x5. She stated she was able to file her taxes and plans to relocate to [place] when she receives her refund. [name] noted she is still smoking marijuana to help with sleep. She expressed interest in quitting. She appeared receptive to urge surfing. [name] engaged in discussion and was receptive to focusing on what's within her control. She noted imagery of taking care of the younger version of herself appears to be helpful. [name] noted she has started requested records so she can easily transfer services to [place]. She will continue to be seen at this office until she moves.   Therapist Response: Conducted telehealth session with [name]. Began session with check-in/update since last session. Utilized empathetic and reflective listening. Used open-ended questions to facilitate discussion. Explained urge surfing to reduce use of marijuana. Reminded [name] to focus on things within her control instead of outside of her control. Engaged in guided imagery exercise for inner child work (taking care of younger version of self). Praised [name] for taking steps to ensure smooth transition to [place]. Scheduled a follow-up appointment and concluded session.   Suicidal/Homicidal: No  Plan: Return again in 2 weeks.  Diagnosis: Bipolar and related disorder (HCC)  Collaboration of Care: Medication Management AEB chart review  Patient/Guardian was advised Release of Information must be obtained prior to any record release in order to collaborate their care with an outside provider. Patient/Guardian was advised if they  have not already done so to contact the registration department to sign all necessary forms in order for [place] to release information regarding their care.   Consent: Patient/Guardian gives verbal consent for treatment and assignment of benefits for services provided  during this visit. Patient/Guardian expressed understanding and agreed to proceed.   [name], [facility] 01/29/2024

## 2024-01-28 ENCOUNTER — Encounter: Payer: Self-pay | Admitting: Obstetrics and Gynecology

## 2024-01-28 ENCOUNTER — Telehealth: Payer: Self-pay

## 2024-01-28 DIAGNOSIS — O34219 Maternal care for unspecified type scar from previous cesarean delivery: Secondary | ICD-10-CM | POA: Insufficient documentation

## 2024-01-28 DIAGNOSIS — Z641 Problems related to multiparity: Secondary | ICD-10-CM | POA: Insufficient documentation

## 2024-01-28 DIAGNOSIS — O09899 Supervision of other high risk pregnancies, unspecified trimester: Secondary | ICD-10-CM | POA: Insufficient documentation

## 2024-01-28 LAB — CBC/D/PLT+RPR+RH+ABO+RUBIGG...
Antibody Screen: NEGATIVE
Basophils Absolute: 0.1 10*3/uL (ref 0.0–0.2)
Basos: 1 %
EOS (ABSOLUTE): 0.2 10*3/uL (ref 0.0–0.4)
Eos: 1 %
HCV Ab: NONREACTIVE
HIV Screen 4th Generation wRfx: NONREACTIVE
Hematocrit: 37.4 % (ref 34.0–46.6)
Hemoglobin: 12.8 g/dL (ref 11.1–15.9)
Hepatitis B Surface Ag: NEGATIVE
Immature Grans (Abs): 0.1 10*3/uL (ref 0.0–0.1)
Immature Granulocytes: 1 %
Lymphocytes Absolute: 2.2 10*3/uL (ref 0.7–3.1)
Lymphs: 15 %
MCH: 32.6 pg (ref 26.6–33.0)
MCHC: 34.2 g/dL (ref 31.5–35.7)
MCV: 95 fL (ref 79–97)
Monocytes Absolute: 1 10*3/uL — ABNORMAL HIGH (ref 0.1–0.9)
Monocytes: 7 %
Neutrophils Absolute: 11.5 10*3/uL — ABNORMAL HIGH (ref 1.4–7.0)
Neutrophils: 75 %
Platelets: 276 10*3/uL (ref 150–450)
RBC: 3.93 x10E6/uL (ref 3.77–5.28)
RDW: 12.6 % (ref 11.7–15.4)
RPR Ser Ql: NONREACTIVE
Rh Factor: POSITIVE
Rubella Antibodies, IGG: 1.6 {index} (ref >0.99)
Varicella zoster IgG: REACTIVE
WBC: 15.2 10*3/uL — ABNORMAL HIGH (ref 3.4–10.8)

## 2024-01-28 LAB — HCV INTERPRETATION

## 2024-01-28 MED ORDER — ASPIRIN 81 MG PO TBEC: 81.0000 mg | DELAYED_RELEASE_TABLET | Freq: Every day | ORAL | 2 refills | Status: DC

## 2024-01-28 NOTE — Patient Instructions (Signed)
 Second Trimester of Pregnancy  The second trimester of pregnancy is from week 14 through week 27. This is months 4 through 6 of pregnancy. During the second trimester: Morning sickness is less or has stopped. You may have more energy. You may feel hungry more often. At this time, your unborn baby is growing very fast. At the end of the sixth month, the unborn baby may be up to 12 inches [place] and weigh about 1 pounds. You will likely start to feel the baby move between 16 and 20 weeks of pregnancy. Body changes during your second trimester Your body continues to change during this time. The changes usually go away after your baby is born. Physical changes You will gain more weight. Your belly will get bigger. You may begin to get stretch marks on your hips, belly, and breasts. Your breasts will keep growing and may hurt. You may get dark spots or blotches on your face. A dark line from your belly button to the pubic area may appear. This line is called linea nigra. Your hair may grow faster and get thicker. Health changes You may have headaches. You may have heartburn. You may pee more often. You may have swollen, bulging veins (varicose veins). You may have trouble pooping (constipation), or swollen veins in the butt that can itch or get painful (hemorrhoids). You may have back pain. This is caused by: Weight gain. Pregnancy hormones that are relaxing the joints in your pelvis. Follow these instructions at home: Medicines Talk to your health care provider if you're taking medicines. Ask if the medicines are safe to take during pregnancy. Your provider may change the medicines that you take. Do not take any medicines unless told to by your provider. Take a prenatal vitamin that has at least 600 micrograms (mcg) of folic acid. Do not use herbal medicines, illegal drugs, or medicines that are not approved by your provider. Eating and drinking While you're pregnant your body needs  extra food for your growing baby. Talk with your provider about what to eat while pregnant. Activity Most women are able to exercise during pregnancy. Exercises may need to change as your pregnancy goes on. Talk to your provider about your activities and exercise routines. Relieving pain and discomfort Wear a good, supportive bra if your breasts hurt. Rest with your legs raised if you have leg cramps or low back pain. Take warm sitz baths to soothe pain from hemorrhoids. Use hemorrhoid cream if your provider says it's okay. Do not douche. Do not use tampons or scented pads. Do not use hot tubs, steam rooms, or saunas. Safety Wear your seatbelt at all times when you're in a car. Talk to your provider if someone hits you, hurts you, or yells at you. Talk with your provider if you're feeling sad or have thoughts of hurting yourself. Lifestyle Certain things can be harmful while you're pregnant. It's best to avoid the following: Do not drink alcohol,smoke, vape, or use products with nicotine or tobacco in them. If you need help quitting, talk with your provider. Avoid cat litter boxes and soil used by cats. These things carry germs that can cause harm to your pregnancy and your baby. General instructions Keep all follow-up visits. It helps you and your unborn baby stay as healthy as possible. Write down your questions. Take them to your prenatal visits. Your provider will: Talk with you about your overall health. Give you advice or refer you to specialists who can help with different needs,  including: Prenatal education classes. Mental health and counseling. Foods and healthy eating. Ask for help if you need help with food. Where to find more information American Pregnancy Association: americanpregnancy.org [employer] of [employer]stetricians and Gynecologists: acog.org Office on [employer] Health: TravelLesson.ca Contact a health care provider if: You have a headache that does not go away  when you take medicine. You have any of these problems: You can't eat or drink. You throw up or feel like you may throw up. You have watery poop (diarrhea) for 2 days or more. You have pain when you pee or your pee smells bad. You have been sick for 2 days or more and are not getting better. Contact your provider right away if: You have any of these coming from your vagina: Abnormal discharge. Bad-smelling fluid. Bleeding. Your baby is moving less than usual. You have contractions, belly cramping, or have pain in your pelvis or lower back. You have symptoms of high blood pressure or preeclampsia. These include: A severe, throbbing headache that does not go away. Sudden or extreme swelling of your face, hands, legs, or feet. Vision problems: You see spots. You have blurry vision. Your eyes are sensitive to light. If you can't reach the provider, go to an urgent care or emergency room. Get help right away if: You faint, become confused, or can't think clearly. You have chest pain or trouble breathing. You have any kind of injury, such as from a fall or a car crash. These symptoms may be an emergency. Call 911 right away. Do not wait to see if the symptoms will go away. Do not drive yourself to the hospital. This information is not intended to replace advice given to you by your health care provider. Make sure you discuss any questions you have with your health care provider. Document Revised: 09/16/2023 Document Reviewed: 04/16/2023 Elsevier Patient Education  2024 [employer].

## 2024-01-28 NOTE — Telephone Encounter (Signed)
Patient called today asking about her AVS. She is moving tonight. She got a job in [place] and has to start soon. She may be unable to come in for her urine labs, but she said she was going to try.

## 2024-01-31 LAB — CERVICOVAGINAL ANCILLARY ONLY
Bacterial Vaginitis (gardnerella): POSITIVE — AB
Candida Glabrata: NEGATIVE
Candida Vaginitis: NEGATIVE
Chlamydia: NEGATIVE
Comment: NEGATIVE
Comment: NEGATIVE
Comment: NEGATIVE
Comment: NEGATIVE
Comment: NEGATIVE
Comment: NORMAL
Neisseria Gonorrhea: NEGATIVE
Trichomonas: NEGATIVE

## 2024-02-02 ENCOUNTER — Encounter: Payer: Self-pay | Admitting: Psychiatry

## 2024-02-02 ENCOUNTER — Telehealth: Payer: Medicaid Other | Admitting: Psychiatry

## 2024-02-02 DIAGNOSIS — F4310 Post-traumatic stress disorder, unspecified: Secondary | ICD-10-CM | POA: Diagnosis not present

## 2024-02-02 DIAGNOSIS — F1220 Cannabis dependence, uncomplicated: Secondary | ICD-10-CM

## 2024-02-02 DIAGNOSIS — F319 Bipolar disorder, unspecified: Secondary | ICD-10-CM | POA: Diagnosis not present

## 2024-02-02 LAB — CYTOLOGY - PAP
Comment: NEGATIVE
Diagnosis: NEGATIVE
High risk HPV: NEGATIVE

## 2024-02-02 MED ORDER — LAMOTRIGINE 25 MG PO TABS: 25.0000 mg | ORAL_TABLET | Freq: Two times a day (BID) | ORAL | 0 refills | Status: DC
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
## 2024-02-04 ENCOUNTER — Encounter: Payer: Self-pay | Admitting: Obstetrics and Gynecology

## 2024-02-07 LAB — PANORAMA PRENATAL TEST FULL PANEL:PANORAMA TEST PLUS 5 ADDITIONAL MICRODELETIONS

## 2024-02-09 ENCOUNTER — Ambulatory Visit: Payer: Medicaid Other | Admitting: Professional Counselor

## 2024-02-09 DIAGNOSIS — F319 Bipolar disorder, unspecified: Secondary | ICD-10-CM | POA: Diagnosis not present

## 2024-02-09 DIAGNOSIS — F4310 Post-traumatic stress disorder, unspecified: Secondary | ICD-10-CM | POA: Diagnosis not present
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
## 2024-02-10 ENCOUNTER — Telehealth: Payer: Self-pay

## 2024-02-10 NOTE — Telephone Encounter (Signed)
Pt called about her panorama results. She wanted to know why the gender of the fetus was not available. I advised her of Dr Chalmers Guest advise to f/up with the genetic counselor. Pt states she will.

## 2024-02-28 NOTE — Progress Notes (Signed)
 Let's try one more time to get in touch with her to give her these results or at least find out her new address to mail her these results. Hopefully once she establishes care where she has relocated she will request these records and can then f/u with new provider.

## 2024-02-29 ENCOUNTER — Telehealth: Payer: Self-pay

## 2024-02-29 DIAGNOSIS — O0992 Supervision of high risk pregnancy, unspecified, second trimester: Secondary | ICD-10-CM | POA: Insufficient documentation

## 2024-02-29 NOTE — Telephone Encounter (Signed)
 Called patient she said she moved back to [facility] because the house in [place], didn't fall through for her. She said she has an appointment with [facility]. I explained to her that her Genetic screening came back abnormal and that she need to follow through with next steps. I offered her an appointment to be seen with Dr. Valentino Saxon, patient declined.

## 2024-02-29 NOTE — Telephone Encounter (Signed)
 Additionally patient has been offered genetic counseling through [facility] as they have also been attempting to reach her about her results as well.

## 2024-03-02 NOTE — Progress Notes (Signed)
 HPI: [name] is here to initiate prenatal care.  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 Overall, doing good.   She is currently experiencing no problems and denies sharp abdominal pain and vaginal bleeding.   Patient's last menstrual period was 11/01/2023 (approximate). Certain:  yes Regular menses? yes  Cycles every 28-30 days  Estimated Date of Delivery:08/07/2024  Ultrasound? yes Planned pregnancy? no Desired pregnancy? yes Fertility Treatements? no     PMHx:   History of anemia:  yes Exposure to smoke: yes  PSHx:   Past Surgical History:  Procedure Laterality Date  . CESAREAN SECTION  11/28/2006   PLTCS for PTL, twins, malpresentation  . PELVIC LAPAROSCOPY  10/23/2011   Removal of abdominal IUD  . Laparoscopic LSO,LOA and right ovarian cystectomy  07/09/2021  . Dx Lap with removal of ectopic pregnancy and left salpingectomy  07/09/2021  . EGD @ [facility]  05/19/2023   Gastritis/No repeat/SMR  . TUBAL LIGATION      POBHx:   OB History  Gravida Para Term Preterm AB Living  10 5 4 1 4 6   SAB IAB Ectopic Molar Multiple Live Births  1 2 1   1 3     # Outcome Date GA Lbr Len/2nd Weight Sex Type Anes PTL Lv  10 Current           9 Ectopic 07/09/21 [redacted]w[redacted]d            Complications: Ruptured left tubal ectopic pregnancy causing  hemoperitoneum (HHS-HCC)  8 Term 04/11/20 [redacted]w[redacted]d 05:12 / 00:03 2.72 kg (5 lb 15.9 oz) F Vag-Spont EPI  LIV  7 Term 04/30/19 [redacted]w[redacted]d 03:13 / 00:10 2.75 kg (6 lb 1 oz) M Vag-Spont None  LIV  6 IAB 04/2017          5 SAB 01/19/16 [redacted]w[redacted]d         4 IAB 06/28/14 [redacted]w[redacted]d         3 Term 03/13/09 [redacted]w[redacted]d  2.58 kg (5 lb 11 oz) F VBAC EPI    2A Preterm 11/28/06 [redacted]w[redacted]d  0.539 kg (1 lb 3 oz) M CS-LTranv  Y      Birth Comments: Active preterm labor, twins, malpresentation  2B Preterm 11/28/06 [redacted]w[redacted]d  0.539 kg (1 lb 3 oz) M CS-LTranv  Y   1 Term 02/11/06 [redacted]w[redacted]d  2.892 kg (6 lb 6 oz) F Vag-Spont EPI  LIV    OB Risk Assessment: Age >35y? yes Age >40 at [facility]  no Previous recurrent pregnancy loss or stillbirth: no Exposure to smoke: yes ; exposure to second hand smoke, quit smoking marijuana 2 weeks ago , per patient Exposure to drugs or alcohol:  no Contact with children: yes  Genetic Risk Assessment: Family or personal history of sickle cell anemia or trait:  no Family genetic syndromes (tay sachs, fragile x, CF, muscular dystrophy, huntington chorea, etc):  no Familial cardiac defects:  no Family history of mental retardation or autism:  no Maternal metabolic syndrome: no  OB complications: (delete if this is patient's first pregnancy) History of postpartum hemorrhage? no History of preterm preeclampsia <34w? no History of shoulder dystocia? no History of vacuum or forceps? no History of spontaneous PTL/PPROM? yes  Breastfeeding History Tool 1. How many babies have you breastfed? 6 2. How many babies did you have problems breastfeeding? 0 3. How many babies did you feel you were able to successfully breastfeed? 6  GYNHx:  GYN Surgery: None Last pap: 05/07/2017; overdue; Will plan to complete Pap postpartum. Patient agreed with this plan.  Abnormal Pap Hx: yes If abnormal in the past, history of LEEP/CKC: no History of sexually transmitted infections: Chlyamadia (2012)  Allergy List: Allergies   Allergen Reactions  . Latex, Natural Rubber Other (See Comments)    Irritation with condoms    Meds:   Current Outpatient Medications  Medication Sig Dispense Refill  . prenatal vit-iron fum-folic ac (PRENAVITE) tablet Take 1 tablet by mouth once daily     No current facility-administered medications for this visit.    Family History/Genetics Screening: Family History  Problem Relation Name Age of Onset  . No Known Problems Mother    . No Known Problems Father    . No Known Problems Sister    . No Known Problems Sister    . No Known Problems Sister    . No Known Problems Sister    . No Known Problems Sister    . No Known Problems Sister    . No Known Problems Sister    . No Known Problems Brother    . No Known Problems Brother    . No Known Problems Brother    . Diabetes type II Maternal Grandmother    . Diabetes type II Maternal Grandfather    . Diabetes type II Paternal Grandmother    . Diabetes type II Paternal Grandfather    . No Known Problems Daughter    . No Known Problems Daughter    . Hydrocephalus Son    . No Known Problems Son    . Coronary Artery Disease (Blocked arteries around heart) Neg Hx    . High blood pressure (Hypertension) Neg Hx      Social History: Name of partner: Declined   Partner's Occupation: Declined  Patient's Occupation: [employer]  History of abuse (sexual, emotional, physical): no History of anxiety/depression: yes Any social issues not addressed above? no  Infectious Screening: Risk factors for TB? no Varicella infection in the past or immunization: yes Cats in home: no Zika virus exposure:  no Flu vaccine: Declined  Covid-19 vaccine: Declined  History of blood transfusion: no  Planning/Education: Willing to accept a blood transfusion if her life depended on it? yes Have you received Rhogam with a past pregnancy or during this pregnancy? no Patient informed that HIV testing will be performed during her prenatal  care and agrees to testing: yes  Patient Active Problem List  Diagnosis  . Depression  . Anxiety disorder  . Calculus of gallbladder with cholecystitis without biliary obstruction, unspecified cholecystitis acuity  . Abnormal cervical Papanicolaou smear  . Marital problems  . Morbid obesity (CMS/HHS-HCC)  . Encounter for supervision of high risk pregnancy in second trimester, antepartum (HHS-HCC)   ROS: Review of Systems  Constitutional: Negative.   Respiratory: Negative.    Cardiovascular: Negative.   Gastrointestinal: Negative.   Genitourinary:  Negative for dysuria, flank pain, frequency and urgency.  Foul vaginal odor   Neurological: Negative.   Psychiatric/Behavioral: Negative.      EXAM: Constitutional: Vitals:   03/02/24 0929  Pulse: 76   Body mass index is 30.16 kg/m.  Physical Exam Constitutional:      Appearance: Normal appearance.  HENT:     Head: Normocephalic.  Cardiovascular:     Rate and Rhythm: Normal rate.  Pulmonary:     Effort: Pulmonary effort is normal.  Abdominal:     Comments: Gravid, c/w [redacted]w[redacted]d  Musculoskeletal:        General: Normal range of motion.     Cervical back: Normal range of motion.  Neurological:     Mental Status: She is alert and oriented to person, place, and time.  Skin:    General: Skin is warm.  Psychiatric:        Mood and Affect: Mood normal.        Behavior: Behavior normal.        Thought Content: Thought content normal.        Judgment: Judgment normal.  Vitals reviewed.      Assessment & Plan:   38 y.o. 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, trisomy 41, not applicable or unspecified (HHS-HCC) Plan: Ambulatory Referral to Maternal-Fetal Medicine  Anxiety disorder, unspecified type  Depression during pregnancy in second trimester (HHS-HCC)  PTSD (post-traumatic stress disorder)  Bipolar affective disorder, remission status unspecified (CMS/HHS-HCC)  Screening for diabetes mellitus Plan: Glucose Tolerance, OB/Glucola, Hemoglobin A1C  Screening for thyroid  disorder  Routine screening for STI (sexually transmitted infection) Plan: Xpert CT/NG, PCR - [facility]  Encounter for fetal anatomic survey (HHS-HCC) Plan: US  [facility] OB greater than 14 weeks single gest        routine anatomy  Vaginal odor Plan: Wet Prep  1. Encounter for supervision of other normal pregnancy in second trimester : - G/CC, TSH, HgbA1c, UA, urine culture, and CBC collected today (03/02/2024).  - Order for Anatomy Scan placed today (03/02/2024) - Reviewed nutrition, diet, and exercise in pregnancy. Healthy diet and lifestyle    choices encouraged.  - EPDS: 2  - FHR: 145 bpm, Fundal Height: 17 cm  - Reviewed dietary instructions and restrictions: increased protein, fruits and    vegetables daily and ample water intake; avoiding fish more than 1-2 servings    per week, unpasturized dairy products and any recalled products to avoid    listeriosis, raw meat, raw fish and need to heat hot dogs and deli meats prior to    eating. - Reviewed prenatal vitamin - one everyday. - Precautions, limitations, and expectations reviewed - Reviewed normal pregnancy discomforts and comfort measures  - OTC meds reviewed  2. Pregnancy BMI: Body mass index is 30.16 kg/m. Recommended weight gain discussed. IOM/WHO weight gain recommendations for  pregnancy  2009 Singleton pregnancy -- The current recommendations for singleton pregnancy are: Body mass index is 30.16 kg/m.  TWG: 1.633 kg (3 lb 9.6 oz)  Expected: 7 kg (15 lb)-11.5 kg (25 lb)  1 hr GTT, P/C ratio and CMP ordered (03/02/2024)-->BMI: 30.16  3. Genetic testing and screening: - Discussed options for genetic screening. Limitations of testing reviewed.   - Discussed cfDNA screen after 9 weeks to test for aneuploidy. Patient completed MaterniT21--> Atypical finding     involving chromosome 18.  - Discussed AFP/quad screen between 15 & 21 weeks. Patient desires this option . AFP testing ordered today    (03/02/2024). - CF/SMA screening discussed. Patient declines this option.  - Discussed the Anatomical Ultrasound to be done by 20 weeks here. -The patient was encouraged to get counseling with genetics.   4. Maternal Care for (suspected) chromosomal abnormality in fetus, trisomy 81, not applicable or unspecified  - Referral for MFM and genetic counseling placed (03/02/2024). All questions/concerns answered.   5. Anxiety/Bipolar disorder/PTSD/Depression - Per patient, seeing [facility] psychiatrist for management (03/02/2024).   6. Vaginal odor - Patient reports foul vaginal odor and desires testing for BV.  - Wet prep collected today (03/02/2024)  - Patient informed that OB provider will notify her of results via MyChart or phone call and treat according.      Patient verbalized understanding.   7. Oriented to [facility] OB/GYN practice model:  I have reviewed the complimentary nature of our practice which incorporates diverse care providers including certified nurse midwives and physicians. I have reviewed that we each provide a unique perspective on pregnancy and birth experience that will enhance their experience and encourage that patient/couple seek to see all types of our highly qualified providers. I have reviewed that most patients will have approximately 12-14 visits  and an opportunity to meet all members of their care team. Shared call  between providers and [facility] site of delivery were also discussed as well as resources for childbirth and breastfeeding education at the [facility]. [facility] OBGYN Prenatal Booklet was also reviewed with the patient.  - Call for warning signs (vaginal bleeding, cramping, change in discharge) or worsening of symptoms. Reviewed how and when to call the office, patient verbalized understanding.  - Return in about 4 weeks (around 03/30/2024) for Routine Prenatal.   I personally performed the service, non-incident to. (WP)   Jazmine Liboon, CNM 03/02/2024 10:21 AM

## 2024-03-06 ENCOUNTER — Telehealth: Payer: Self-pay | Admitting: Psychiatry

## 2024-03-06 NOTE — Progress Notes (Signed)
 Notes received from referring office via:  []   No records, patient self-referred or no previous OB care.  []   Care Everywhere  [x]   Received notes via fax and scanned into patient's chart.  [x]   Received notes via fax and emailed to [facility] PSA. [facility] MFM)  [x]   Received notes via fax and emailed to the [employer] planner. [facility] M[facility])  []   Note found in patient's chart from triage/provider stating that notes were received.

## 2024-03-06 NOTE — Telephone Encounter (Signed)
 Patient left voice message on 03-06-24 to cancel appointments on 03-07-24 & 03-14-24(Dr. Eappen) due to she is moving out of state. Appointments canceled per patient request.

## 2024-03-07 ENCOUNTER — Ambulatory Visit: Payer: Medicaid Other | Admitting: Professional Counselor

## 2024-03-13 ENCOUNTER — Other Ambulatory Visit: Payer: Self-pay | Admitting: Certified Nurse Midwife

## 2024-03-13 DIAGNOSIS — O3512X0 Maternal care for (suspected) chromosomal abnormality in fetus, trisomy 18, not applicable or unspecified: Secondary | ICD-10-CM

## 2024-03-14 ENCOUNTER — Ambulatory Visit: Payer: Medicaid Other | Admitting: Psychiatry

## 2024-03-16 NOTE — Progress Notes (Signed)
 Obstetrics & Gynecology Office Visit   Patient's last menstrual period was 11/01/2023 (approximate). Estimated Date of Delivery: 08/07/24  Patient concerns today: has two bumps on her vagina, they are not painful or itchy. Reports that she noticed these right after her pap smear at the end of January. She also reports that she recently used wart cream for two of her children and did not wash her hands afterwards. She has concern for genital warts and complications they may cause during pregnancy.   Pt denies contractions, vaginal bleeding, leaking fluid. Starting to feel flutters  Pt denies HA, VD or RUQ pain.   BP 114/73   Ht 154.9 cm (5' 1)   Wt 72.8 kg (160 lb 6.4 oz)   LMP 11/01/2023 (Approximate)   BMI 30.31 kg/m   TWG: 1.996 kg (4 lb 6.4 oz)  Gen: NAD  Pulm: No use of accessory muscles, normal respirations Abdomen: Gravid, nontender Ext : No edema, no rashes.   Psych: Mood, insight, judgement intact SVE: single smooth papule 1 mm left from clitoris, 1 cm verrucous plaque to right upper outer labium. Non-tender, flesh colored. No drainage. FHT by doppler: 141 bpm distinguished from mom   38 y.o. [id number] AT 110w3d  The encounter diagnosis was Warts, genital.  Problem list reviewed and/or updated   1. Warts, genital Discussed that the appearances are similar with genital warts. Discussed that these are clinical manifestations of certain strains of the HPV virus. Reviewed that they can sometimes worsen with pregnancy and treatment options that would be safe, including TCA versus waiting until PP period. She does feel anxious knowing they are there and would like to receive treatment. Referral placed to dermatology for possible TCA treatment.  -     Ambulatory Referral to Dermatology  Reviewed early pregnancy precautions.  Call for bleeding and cramping  Return in about 18 days (around 04/03/2024) for Scheduled ROB w/ DVS.   Attestation Statement:   I personally performed  the service, non-incident to. (WP)   Jackie PHILLIPS, FNP [facility] 03/16/2024 12:11 PM

## 2024-03-22 ENCOUNTER — Telehealth: Payer: Self-pay

## 2024-03-22 DIAGNOSIS — O09299 Supervision of pregnancy with other poor reproductive or obstetric history, unspecified trimester: Secondary | ICD-10-CM | POA: Insufficient documentation

## 2024-03-22 DIAGNOSIS — Z8279 Family history of other congenital malformations, deformations and chromosomal abnormalities: Secondary | ICD-10-CM | POA: Insufficient documentation

## 2024-03-22 DIAGNOSIS — O280 Abnormal hematological finding on antenatal screening of mother: Secondary | ICD-10-CM | POA: Insufficient documentation

## 2024-03-22 DIAGNOSIS — O09529 Supervision of elderly multigravida, unspecified trimester: Secondary | ICD-10-CM | POA: Insufficient documentation

## 2024-03-22 DIAGNOSIS — O34219 Maternal care for unspecified type scar from previous cesarean delivery: Secondary | ICD-10-CM | POA: Insufficient documentation

## 2024-03-24 ENCOUNTER — Ambulatory Visit

## 2024-03-24 ENCOUNTER — Other Ambulatory Visit

## 2024-03-24 ENCOUNTER — Ambulatory Visit: Attending: Physician Assistant

## 2024-03-24 DIAGNOSIS — O09522 Supervision of elderly multigravida, second trimester: Secondary | ICD-10-CM

## 2024-03-24 DIAGNOSIS — O09299 Supervision of pregnancy with other poor reproductive or obstetric history, unspecified trimester: Secondary | ICD-10-CM

## 2024-03-24 DIAGNOSIS — O280 Abnormal hematological finding on antenatal screening of mother: Secondary | ICD-10-CM

## 2024-03-24 DIAGNOSIS — O34219 Maternal care for unspecified type scar from previous cesarean delivery: Secondary | ICD-10-CM

## 2024-03-24 DIAGNOSIS — Z8279 Family history of other congenital malformations, deformations and chromosomal abnormalities: Secondary | ICD-10-CM

## 2024-04-05 ENCOUNTER — Ambulatory Visit: Admitting: Psychiatry

## 2024-04-20 ENCOUNTER — Ambulatory Visit (INDEPENDENT_AMBULATORY_CARE_PROVIDER_SITE_OTHER): Admitting: Professional Counselor

## 2024-04-20 DIAGNOSIS — F319 Bipolar disorder, unspecified: Secondary | ICD-10-CM | POA: Diagnosis not present

## 2024-04-20 NOTE — Progress Notes (Signed)
  THERAPIST PROGRESS NOTE  Session Time: 3:02 PM - 3:52 PM  Participation Level: Active  Behavioral Response: Casual, Alert, Negative and Anxious  Type of Therapy: Individual Therapy  Treatment Goals addressed: Active BIPOLAR DISORDER-MANIA/HYPOMANIA    LTG: "When I start seeing me be stable at a job. Like a healthy relationship with my kids. I don't have to yell at them. Just parenting skills, being more patient with them. There's days I won't shower or brush my teeth. So just regulating my emotions.                Start:  01/18/24    Expected End:  01/16/25      STG: To improve relationship satisfaction AEB identifying healthy factors within a relationship and setting healthy boundaries over the next 12 weeks.     STG: Will work on achieving personal goals with boxing to build mastery and sense of self.    STG: Saga will identify coping strategies to deal with trauma memories and the associated emotional reaction   ProgressTowards Goals: Progressing  Interventions: CBT, Motivational Interviewing, and Supportive  Summary: [name] is a 38 y.o. female who presents with  a history of bipolar disorder and PTSD. She appeared alert and oriented x5. She reported she did go to [place]  but was unable to secure proper housing so she came back. [name] plans to finish out her pregnancy before going back to [place] . She still plans to allow family from the baby father to adopt the child. [name] reported things have been going well, coparenting with her husband. He is not staying in the home, but has been active in helping with the kids. [name] reported she is trying to make more progress on her mental health as she doesn't want her children to suffer with her mood swings/behaviors.   Therapist Response: Conducted session with [name]. Began session with check-in/update since previous session. Utilized empathetic and reflective listening. Used open-ended questions to facilitate  discussion and summarized [name]'s thoughts/feelings. Gained updates on current situation and evaluated [name]'s motivation for treatment. Scheduled additional appointment and concluded session.   Suicidal/Homicidal: No  Plan: Return again in 2 weeks.  Diagnosis: Bipolar and related disorder (HCC)  Collaboration of Care: Medication Management AEB chart review  Patient/Guardian was advised Release of Information must be obtained prior to any record release in order to collaborate their care with an outside provider. Patient/Guardian was advised if they have not already done so to contact the registration department to sign all necessary forms in order for us  to release information regarding their care.   Consent: Patient/Guardian gives verbal consent for treatment and assignment of benefits for services provided during this visit. Patient/Guardian expressed understanding and agreed to proceed.   Len Quale, [facility] 04/20/2024

## 2024-04-25 ENCOUNTER — Encounter: Payer: Self-pay | Admitting: Obstetrics and Gynecology

## 2024-04-25 ENCOUNTER — Other Ambulatory Visit: Payer: Self-pay | Admitting: Obstetrics and Gynecology

## 2024-04-25 ENCOUNTER — Observation Stay
Admission: EM | Admit: 2024-04-25 | Discharge: 2024-04-25 | Disposition: A | Discharge status: Home / Self Care | Attending: Obstetrics and Gynecology | Admitting: Obstetrics and Gynecology

## 2024-04-25 ENCOUNTER — Other Ambulatory Visit: Payer: Self-pay

## 2024-04-25 DIAGNOSIS — O23592 Infection of other part of genital tract in pregnancy, second trimester: Principal | ICD-10-CM | POA: Insufficient documentation

## 2024-04-25 DIAGNOSIS — O368120 Decreased fetal movements, second trimester, not applicable or unspecified: Secondary | ICD-10-CM | POA: Insufficient documentation

## 2024-04-25 DIAGNOSIS — Z3A25 25 weeks gestation of pregnancy: Secondary | ICD-10-CM | POA: Insufficient documentation

## 2024-04-25 DIAGNOSIS — N898 Other specified noninflammatory disorders of vagina: Principal | ICD-10-CM | POA: Diagnosis present

## 2024-04-25 LAB — WET PREP, GENITAL
Sperm: NONE SEEN
Trich, Wet Prep: NONE SEEN
WBC, Wet Prep HPF POC: <10 (ref <10)

## 2024-04-25 NOTE — Discharge Summary (Signed)
 Patient ID: [name] MRN: [id number] DOB/AGE: 06-17-1986 38 y.o.  Admit date: 04/25/2024 Discharge date: 04/25/2024  Admission Diagnoses: 38yo G10P6 at [redacted]w[redacted]d present with increased vaginal discharge and decreased fetal movement.  Discharge is described as clear and watery starting about 5 days ago.  Denies UC, VB or cramping. She also reports a history of BV.  Discharge Diagnoses: Yeast infection and BV  Factors complicating pregnancy: Maternal Age >80 yo H/O cesarean section due to preterm labor of twins VBAC x3 H/o mental health diagnoses: Bipolar, PTSD, Anxiety, Depression H/O Pre-e Chronic Cannabis use, moderate dependence Obesity BMI: 30.31   Prenatal Procedures: NST  Consults: None  Significant Diagnostic Studies:  No results found for this or any previous visit (from the past week).  Treatments: none  Hospital Course:  This is a 38 y.o. [id number] with IUP at [redacted]w[redacted]d seen for leukorrhea and decreased fetal movement.  No leaking of fluid and no bleeding.  She was asked to cough with no sign of gross rupture.  Wet prep was positive for yeast and BV - Medication was sent to her pharmacy through the [employer] office system.  She was observed, fetal heart rate monitoring remained reassuring, and she had no signs/symptoms of preterm labor or other maternal-fetal concerns.   She received a phone call from her son's school and she needed to go.  She felt safe monitoring fetal movement and vaginal discharge from home and will call if she is concerned.  She was deemed stable for discharge to home with outpatient follow up.  Discharge Physical Exam:  BP 115/70 (BP Location: Left Arm)   Pulse 66   Temp 97.9 F (36.6 C) (Oral)   Resp 16   Ht 5\' 1"  (1.549 m)   Wt 70.3 kg   LMP 11/01/2023 (Exact Date)   BMI 29.29 kg/m   General: NAD CV: RRR Pulm: nl effort ABD: s/nd/nt, gravid DVT Evaluation: LE non-ttp, no evidence of DVT on exam.  NST: FHR baseline: 135 bpm Variability:  moderate Accelerations: yes Decelerations: none Category/reactivity: reactive  TOCO: quiet SVE: deferred      Discharge Condition: Stable  Disposition: Discharge disposition: 01-Home or Self Care        Allergies as of 04/25/2024   No Known Allergies      Medication List     STOP taking these medications    lamoTRIgine  25 MG tablet Commonly known as: LaMICtal        TAKE these medications    aspirin  EC 81 MG tablet Take 1 tablet (81 mg total) by mouth daily. Take after 12 weeks for prevention of preeclampssia later in pregnancy   Doxylamine -Pyridoxine  10-10 MG Tbec Two tablets at bedtime on day 1 and 2; if symptoms persist, take 1 tablet in morning and 2 tablets at bedtime on day 3; if symptoms persist, may increase to 1 tablet in morning, 1 tablet mid-afternoon, and 2 tablets at bedtime on day 4 (maximum: doxylamine  40 mg/pyridoxine  40 mg   IRON PO Take 1 tablet by mouth daily.   multivitamin tablet Take 1 tablet by mouth daily.   Vitamin D 125 MCG (5000 UT) Caps Take 1 capsule by mouth daily.        Follow-up Information     [facility] Follow up.   Why: Keep all scheduled appointments Contact information: [address] [place]  [address] [phone]                Signed:  Collin Deal,  CNM 04/25/2024 2:09 PM

## 2024-04-25 NOTE — OB Triage Note (Signed)
 Pt [id number] presents for LOF and decreased fetal movement. Pt reports she noticed increased clear watery discharge starting Thursday that has continued. Pt reports feeling less fetal movement the last 2 days. VSS. Denies bleeding/ctx. RN and pt felt fetal movement while monitors were being applied. Pt reports 1 prior c/s for twins and preE/uterine prolapse with her last delivery. Reports history of BV. Pt sees MFM for abnormal genetic testing-scans have been normal so far.

## 2024-04-25 NOTE — OB Triage Note (Signed)
Pt discharged home per order.   Pt stable and ambulatory and an After Visit Summary was printed and given to the patient. Discharge education completed with patient/family including follow up instructions, appointments, and medication list. Pt received labor and bleeding precautions. Patient able to verbalize understanding, all questions fully answered upon discharge.   Pt discharged home via personal vehicle with support person.  

## 2024-04-26 NOTE — Progress Notes (Signed)
 Obstetrics & Gynecology Office Visit  Subjective  [name] is a 38 y.o. [phone] at [redacted]w[redacted]d being seen today for ongoing prenatal care.  She is currently monitored for this low-risk pregnancy.  Patient's last menstrual period was 11/01/2023 (approximate). Estimated Date of Delivery: 08/07/24  Patient concerns today: Patient expresses concerns of BV and yeast infections. She was recently seen in the ED on 04/25/2024 for symptoms of BV and yeast. She states she received antibiotic and has no further questions.   Pt denies contractions, vaginal bleeding, leaking fluid. Endorses good fetal movement Pt denies HA, VD or RUQ pain.   Objective  BP 119/78   Pulse 75   Ht 154.9 cm (5' 1)   Wt 76.2 kg (168 lb)   LMP 11/01/2023 (Approximate)   BMI 31.74 kg/m    Pre-pregnant weight: 70.8 kg (156 lb)  TWG: 5.443 kg (12 lb)  Gen: NAD  Pulm: No use of accessory muscles, normal respirations Abdomen: Gravid, nontender Ext : No edema, no rashes.   Psych: Mood, insight, judgement intact SVE: Deferred  FHT by doppler: 140 bpm distinguished from mom Fundal height: 26 cm   Assessment   38 y.o. [id number] at [redacted]w[redacted]d by  08/07/2024, by Last Menstrual Period presenting for routine prenatal visit.  Plan   Problem list reviewed and/or updated  1. Encounter for supervision of other normal pregnancy in second trimester (HHS-HCC) - 1hr GTT, CBC, and RPR at next visit routine prenatal visit. Discussed diabetes screen in pregnancy.    - HgbA1c: 5.6 (03/02/2024)  - Patient unable to complete/declined early 1hr GTT.  - TDAP vaccine discussed, recommended at 27-36 weeks.   - She desires the TDAP vaccine at the next visit.  - Contraception options reviewed - she has chosen an option. She is considering a bilateral tubal ligation.   2. AMA (advanced maternal age) multigravida 35+, second trimester (HHS-HCC) - Plan NST/AFI twice weekly @ 39 weeks.   3. History of cesarean delivery - Desiring repeat  c-section.    4. Marijuana use during pregnancy (HHS-HCC) - Patient states she eats edibles. Per patient, she only takes it nightly (one gummy).  - Patient encouraged to stop usage and informed that THC use in pregnancy is not recommended. Patient verbalized    understanding.  5. Obesity (BMI 35.0-39.9 without comorbidity), unspecified - BMI: 31.74 - TWG: 5.443 kg (12 lb) - TWGE: 7 kg (15 lb)-11.5 kg (25 lb)   6. H/o mental health diagnoses: Bipolar, PTSD, Anxiety, Depression - Patient expresses that she sees counseling and is managing mental health well.   7. Bacterial Vaginal Infection (BV and Yeast) - Prescribed antibiotics by ED on 04/22/2024, per patient.  - Patient informed to notify OB provider if symptoms continue to persist. Patient verbalized understanding.     Reviewed early pregnancy precautions.  Call for bleeding and cramping  Return in about 4 weeks (around 05/24/2024) for Routine Prenatal.   Attestation Statement:   I personally performed the service, non-incident to. (WP)   JAZMINE SIMONE LIBOON, CNM

## 2024-05-03 ENCOUNTER — Ambulatory Visit (INDEPENDENT_AMBULATORY_CARE_PROVIDER_SITE_OTHER): Admitting: Professional Counselor

## 2024-05-03 DIAGNOSIS — F319 Bipolar disorder, unspecified: Secondary | ICD-10-CM

## 2024-05-03 NOTE — Progress Notes (Signed)
  THERAPIST PROGRESS NOTE  Session Time: 4:05 PM - 4:45 PM  Participation Level: Active  Behavioral Response: Casual, Alert, Anxious  Type of Therapy: Individual Therapy  Treatment Goals addressed:  Active BIPOLAR DISORDER-MANIA/HYPOMANIA    LTG: "When I start seeing me be stable at a job. Like a healthy relationship with my kids. I don't have to yell at them. Just parenting skills, being more patient with them. There's days I won't shower or brush my teeth. So just regulating my emotions.                Start:  01/18/24    Expected End:  01/16/25      STG: To improve relationship satisfaction AEB identifying healthy factors within a relationship and setting healthy boundaries over the next 12 weeks.     STG: Will work on achieving personal goals with boxing to build mastery and sense of self.    STG: [name] will identify coping strategies to deal with trauma memories and the associated emotional reaction   ProgressTowards Goals: Not Progressing  Interventions: Motivational Interviewing and Supportive  Summary: [name] is a 38 y.o. female who presents with a history of bipolar disorder and PTSD. She appeared alert and oriented x5. She expressed concerns about not getting what she needs in therapy and continued struggles to make changes. [name] discussed her concerns with her children, parenting, and impact of generational trauma. She was receptive to DBT skills group and will begin this week. She was also receptive to providing choices to her children to promote compliance and reduce negative behavior. She reported having access to a parenting group and was receptive to reaching out to enroll to gain more parenting tips.   Therapist Response: Conducted session with [name]. Began session with check-in/update since previous session. Utilized empathetic and reflective listening. Discussed [name]'s concerns with therapy and noted [name] would benefit from more skill based  therapy rather than talk therapy. She was in agreement to join DBT skills group. She discussed struggles with parenting and was receptive to tip on providing choices. She was in agreement to reach out about parenting group. Completed enrollment in DBT skills group and concluded session.   Suicidal/Homicidal: No  Plan: Return again in 1 weeks.  Diagnosis: Bipolar and related disorder (HCC)  Collaboration of Care: Medication Management AEB chart review  Patient/Guardian was advised Release of Information must be obtained prior to any record release in order to collaborate their care with an outside provider. Patient/Guardian was advised if they have not already done so to contact the registration department to sign all necessary forms in order for us  to release information regarding their care.   Consent: Patient/Guardian gives verbal consent for treatment and assignment of benefits for services provided during this visit. Patient/Guardian expressed understanding and agreed to proceed.   Len Quale, [facility] 05/03/2024

## 2024-05-05 ENCOUNTER — Ambulatory Visit: Admitting: Professional Counselor

## 2024-05-12 ENCOUNTER — Ambulatory Visit: Admitting: Professional Counselor

## 2024-05-12 DIAGNOSIS — F319 Bipolar disorder, unspecified: Secondary | ICD-10-CM

## 2024-05-12 NOTE — Progress Notes (Signed)
  THERAPIST PROGRESS NOTE  Virtual Visit via Video Note  I connected with Bernabe Brew on 05/12/24 at 12:00 PM EDT by a video enabled telemedicine application and verified that I am speaking with the correct person using two identifiers.  Location: Patient: Home Provider: Remote office    I discussed the limitations of evaluation and management by telemedicine and the availability of in person appointments. The patient expressed understanding and agreed to proceed.   I discussed the assessment and treatment plan with the patient. The patient was provided an opportunity to ask questions and all were answered. The patient agreed with the plan and demonstrated an understanding of the instructions.   The patient was advised to call back or seek an in-person evaluation if the symptoms worsen or if the condition fails to improve as anticipated.  I provided 53 minutes of non-face-to-face time during this encounter. Len Quale, [facility]  Session Time: 12:00 PM - 12:53 PM  Participation Level: Active  Behavioral Response: Casual, Alert, Anxious  Type of Therapy: Group Therapy  Interventions: CBT and Supportive  Summary: Amybeth Suttner is a 38 y.o. female who presents with a history of bipolar disorder and PTSD. She appeared anxious but oriented x5. This was her first group session. Antanisha shared that she hopes to find out more about herself and get stabilized. She stated that her week has been "chill." She actively listened during group session and appeared to be taking notes. She reported her goal for this week is to think before she reacts.    Therapist Response: Conducted telehealth group session. Began session with introductions and review of group rules and expectations. Engaged group members in mindfulness exercise - guided meditation for mindfulness. Conducted brief check-in with each group member. Reviewed handouts and worksheets - Mindfulness module - Goals and WISE MIND.  Passenger transport manager with each group member and goal setting between now and next session.   Suicidal/Homicidal: No  Diagnosis: Bipolar and related disorder (HCC)  Patient/Guardian was advised Release of Information must be obtained prior to any record release in order to collaborate their care with an outside provider. Patient/Guardian was advised if they have not already done so to contact the registration department to sign all necessary forms in order for us  to release information regarding their care.   Consent: Patient/Guardian gives verbal consent for treatment and assignment of benefits for services provided during this visit. Patient/Guardian expressed understanding and agreed to proceed.   Len Quale, [facility] 05/12/2024

## 2024-05-17 ENCOUNTER — Ambulatory Visit (INDEPENDENT_AMBULATORY_CARE_PROVIDER_SITE_OTHER): Admitting: Psychiatry

## 2024-05-17 ENCOUNTER — Encounter: Payer: Self-pay | Admitting: Psychiatry

## 2024-05-17 ENCOUNTER — Other Ambulatory Visit: Payer: Self-pay

## 2024-05-17 VITALS — BP 96/64 | HR 62 | Temp 97.6°F | Ht 61.0 in | Wt 172.4 lb

## 2024-05-17 DIAGNOSIS — F1220 Cannabis dependence, uncomplicated: Secondary | ICD-10-CM

## 2024-05-17 DIAGNOSIS — F3162 Bipolar disorder, current episode mixed, moderate: Secondary | ICD-10-CM | POA: Diagnosis not present

## 2024-05-17 DIAGNOSIS — F4310 Post-traumatic stress disorder, unspecified: Secondary | ICD-10-CM | POA: Diagnosis not present

## 2024-05-17 MED ORDER — LAMOTRIGINE 25 MG PO TABS: ORAL_TABLET | ORAL | 0 refills | Status: DC

## 2024-05-17 NOTE — Progress Notes (Signed)
 [facility] MD OP Progress Note  05/17/2024 11:51 AM [name]  MRN:  [id number]  Chief Complaint:  Chief Complaint  Patient presents with   Follow-up   Mood swings   Anxiety   Medication Refill   Discussed the use of AI scribe software for clinical note transcription with the patient, who gave verbal consent to proceed.  History of Present Illness [name] is a 38 year old Hispanic female currently unemployed, divorced, pregnant, 28 weeks, lives in [place], has a history of PTSD, bipolar disorder, cannabis abuse in early remission was evaluated in office today.    She has been experiencing ongoing challenges with anxiety and mood disorder. She has been more compliant with Lamictal .  Tolerating it well so far.  She does have multiple ongoing situational stresses and hence it has been challenging to manage her mood. She has started a new group therapy, a DBT group, to help manage her mental health. She has experienced difficulty maintaining employment due to anxiety and overthinking, which has led her to apply for SSI benefits. She had an evaluation with a psychiatrist on May 3rd and is awaiting the outcome.  She is currently pregnant and expecting her baby in August. She has been taking prenatal vitamins and reports that the baby is healthy. She plans to breastfeed for at least six months, as she has done with her other children. She has stopped smoking marijuana since February or March and is focusing on her mental health. She feels tired and sometimes struggles with daily activities like cleaning.  She visited the emergency department on April 29th for  bacterial vaginosis, which was treated at urgent care.  She completed a course of treatment for the same and is currently better.  She reports sleep is overall good.  She was prescribed doxylamine  by her OB/GYN however she has not been taking it.  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  Denies thoughts of self-harm or harm to others.     Visit Diagnosis:    ICD-10-CM   1. PTSD (post-traumatic stress disorder)  F43.10     2. Bipolar 1 disorder, mixed, moderate (HCC)  F31.62 lamoTRIgine  (LAMICTAL ) 25 MG tablet    3. Cannabis use disorder, moderate, dependence (HCC)  F12.20    in early remission      Past Psychiatric History: I have reviewed past psychiatric history from progress note on 12/04/2023.  May have tried multiple psychotropics in the past however does not remember the names.  Past Medical History:  Past Medical History:  Diagnosis Date   Anxiety    Depression    Uterine prolapse    second degree in 2021    Past Surgical History:  Procedure Laterality Date   CESAREAN SECTION     CHOLECYSTECTOMY     DIAGNOSTIC LAPAROSCOPY WITH REMOVAL OF ECTOPIC PREGNANCY Left 07/09/2021   Procedure: DIAGNOSTIC LAPAROSCOPY WITH REMOVAL OF ECTOPIC PREGNANCY; LEFT SALPINGECTOMY;  Surgeon: Carolynn Citrin, MD;  Location: [facility] ORS;  Service: Gynecology;  Laterality: Left;   LAPAROSCOPIC OVARIAN CYSTECTOMY Right 07/09/2021   Procedure: LAPAROSCOPIC OVARIAN CYSTECTOMY;  Surgeon: Schermerhorn, Joselyn Nicely, MD;  Location: [facility] ORS;  Service: Gynecology;  Laterality: Right;   WISDOM TOOTH EXTRACTION  2017   three;    Family Psychiatric History: I have reviewed family psychiatric history from progress note on 12/04/2023.  Family History:  Family History  Problem Relation Age of Onset   Diabetes Mother  pre   Drug abuse Father    Healthy Sister    Healthy Sister    Healthy Sister    Healthy Sister    Healthy Sister    Healthy Sister    Healthy Sister    Healthy Brother    Healthy Brother    Healthy Brother    Cancer Maternal Grandmother 60       colon   Diabetes Maternal Grandmother    Heart Problems Maternal Grandmother    Heart Problems Maternal Grandfather    Diabetes Maternal Grandfather    Diabetes Paternal Grandmother    Diabetes  Paternal Grandfather    Alzheimer's disease Paternal Grandfather    Bipolar disorder Maternal Aunt     Social History: I have reviewed social history from progress note on 12/04/2023. Social History   Socioeconomic History   Marital status: Legally Separated    Spouse name: Not on file   Number of children: 6   Years of education: 14   Highest education level: Associate degree: occupational, Scientist, product/process development, or vocational program  Occupational History   Occupation: traffic control; Lawyer  Tobacco Use   Smoking status: Never   Smokeless tobacco: Never  Vaping Use   Vaping status: Former   Substances: Mixture of cannabinoids  Substance and Sexual Activity   Alcohol use: Not Currently    Comment: rarely   Drug use: Not Currently    Types: Marijuana   Sexual activity: Yes    Partners: Male    Birth control/protection: None  Other Topics Concern   Not on file  Social History Narrative   Not on file   Social Drivers of Health   Financial Resource Strain: Low Risk  (03/02/2024)   Received from [facility]   Overall Financial Resource Strain (CARDIA)    Difficulty of Paying Living Expenses: Not hard at all  Food Insecurity: No Food Insecurity (03/02/2024)   Received from [facility]   Hunger Vital Sign    Worried About Running Out of Food in the Last Year: Never true    Ran Out of Food in the Last Year: Never true  Recent Concern: Food Insecurity - Food Insecurity Present (12/17/2023)   Hunger Vital Sign    Worried About Running Out of Food in the Last Year: Sometimes true    Ran Out of Food in the Last Year: Sometimes true  Transportation Needs: No Transportation Needs (03/02/2024)   Received from [facility]   PRAPARE - Transportation    In the past 12 months, has lack of transportation kept you from medical appointments or from getting medications?: No    Lack of Transportation (Non-Medical): No  Physical  Activity: Sufficiently Active (12/23/2023)   Exercise Vital Sign    Days of Exercise per Week: 5 days    Minutes of Exercise per Session: 120 min  Stress: Stress Concern Present (12/23/2023)   [employer] of Occupational Health - Occupational Stress Questionnaire    Feeling of Stress : Rather much  Social Connections: Socially Isolated (12/23/2023)   Social Connection and Isolation Panel [NHANES]    Frequency of Communication with Friends and Family: Once a week    Frequency of Social Gatherings with Friends and Family: Never    Attends Religious Services: Never    Database administrator or Organizations: No    Attends Banker Meetings: Never    Marital Status: Separated    Allergies: No Known Allergies  Metabolic Disorder Labs: No results found for: "HGBA1C", "MPG" No results found for: "PROLACTIN" No results found for: "CHOL", "TRIG", "HDL", "CHOLHDL", "VLDL", "LDLCALC" Lab Results  Component Value Date   TSH 0.786 12/07/2023    Therapeutic Level Labs: No results found for: "LITHIUM" No results found for: "VALPROATE" No results found for: "CBMZ"  Current Medications: Current Outpatient Medications  Medication Sig Dispense Refill   aspirin  EC 81 MG tablet Take 1 tablet (81 mg total) by mouth daily. Take after 12 weeks for prevention of preeclampssia later in pregnancy 300 tablet 2   Cholecalciferol (VITAMIN D) 125 MCG (5000 UT) CAPS Take 1 capsule by mouth daily.     Doxylamine -Pyridoxine  10-10 MG TBEC Two tablets at bedtime on day 1 and 2; if symptoms persist, take 1 tablet in morning and 2 tablets at bedtime on day 3; if symptoms persist, may increase to 1 tablet in morning, 1 tablet mid-afternoon, and 2 tablets at bedtime on day 4 (maximum: doxylamine  40 mg/pyridoxine  40 mg 60 tablet 1   Ferrous Sulfate (IRON PO) Take 1 tablet by mouth daily.     hydrOXYzine  (ATARAX ) 25 MG tablet Take 12.5-25 mg by mouth 3 (three) times daily as needed for anxiety.      lamoTRIgine  (LAMICTAL ) 25 MG tablet Take 3 tablets (75 mg total) by mouth daily for 15 days, THEN 4 tablets (100 mg total) daily for 15 days. 105 tablet 0   Multiple Vitamin (MULTIVITAMIN) tablet Take 1 tablet by mouth daily.     No current facility-administered medications for this visit.     Musculoskeletal: Strength & Muscle Tone: within normal limits Gait & Station: normal Patient leans: N/A  Psychiatric Specialty Exam: Review of Systems  Psychiatric/Behavioral:  The patient is nervous/anxious.        Mood swings    Blood pressure 96/64, pulse 62, temperature 97.6 F (36.4 C), temperature source Temporal, height 5\' 1"  (1.549 m), weight 172 lb 6.4 oz (78.2 kg), last menstrual period 11/01/2023.Body mass index is 32.57 kg/m.  General Appearance: Casual  Eye Contact:  Fair  Speech:  Clear and Coherent  Volume:  Normal  Mood:  Mood swings, anxiety  Affect:  Appropriate  Thought Process:  Goal Directed and Descriptions of Associations: Intact  Orientation:  Full (Time, Place, and Person)  Thought Content: Logical   Suicidal Thoughts:  No  Homicidal Thoughts:  No  Memory:  Immediate;   Fair Recent;   Fair Remote;   Fair  Judgement:  Fair  Insight:  Fair  Psychomotor Activity:  Normal  Concentration:  Concentration: Fair and Attention Span: Fair  Recall:  [employer] of Knowledge: Fair  Language: Fair  Akathisia:  No  Handed:  Right  AIMS (if indicated): not done  Assets:  Communication Skills Desire for Improvement Housing Social Support Transportation  ADL's:  Intact  Cognition: WNL  Sleep:  Fair   Screenings: GAD-7    Garment/textile technologist Visit from 05/17/2024 in [facility] Counselor from 12/17/2023 in [facility]  Total GAD-7 Score 19 19      PHQ2-9    Flowsheet Row Office Visit from 05/17/2024 in [facility] Counselor from 12/17/2023  in [facility] Video Visit from [facility]/04/2023 [facility]  PHQ-2 Total Score 4 4 6   PHQ-9 Total Score 17 22 21       Flowsheet Row Office Visit from 05/17/2024  in [facility] Video Visit from 02/02/2024 in [facility] ED from 12/28/2023 in [facility] Department at [facility]  C-SSRS RISK CATEGORY Moderate Risk Moderate Risk No Risk        Assessment and Plan: [name] is a 38 year old Hispanic female, currently pregnant, 28 weeks, has a history of PTSD, bipolar disorder, was evaluated in office for a follow-up.  Posttraumatic stress disorder-improving Ongoing mood symptoms although with some improvement.  Currently engaged in therapy and this also in DBT groups. - Continue psychotherapy sessions and DBT groups. - Continue Lamictal  as prescribed. - Will consider addition of an SSRI in the future as needed - Continue Hydroxyzine  12.5-25 mg 3 times a day as needed  Bipolar disorder mixed moderate-unstable Currently with ongoing mood swings, irritability as well as multiple situational stressors including being pregnant.  Does report good social support system and is engaged in psychotherapy. - Increase Lamotrigine  to 75 mg daily for 2 weeks and then to 100 mg daily in divided dosage. - Continue psychotherapy sessions.  Cannabis use in early remission Currently reports not using cannabis since at least February or March. - Encouraged abstinence.  Follow-up Follow-up in clinic in 1 month or sooner if needed. - Encouraged to continue prenatal care.     Collaboration of Care: Collaboration of Care: Other encouraged to continue CBT.  I have reviewed notes per Ms. Josiephine Nightingale dated 05/12/2024-patient currently in CBT as well as DBT groups.  Patient/Guardian was advised Release of Information must be  obtained prior to any record release in order to collaborate their care with an outside provider. Patient/Guardian was advised if they have not already done so to contact the registration department to sign all necessary forms in order for us  to release information regarding their care.   Consent: Patient/Guardian gives verbal consent for treatment and assignment of benefits for services provided during this visit. Patient/Guardian expressed understanding and agreed to proceed.   This note was generated in part or whole with voice recognition software. Voice recognition is usually quite accurate but there are transcription errors that can and very often do occur. I apologize for any typographical errors that were not detected and corrected.    Hugo Lybrand, MD 05/18/2024, 8:17 AM

## 2024-05-19 ENCOUNTER — Ambulatory Visit: Admitting: Professional Counselor

## 2024-05-26 ENCOUNTER — Ambulatory Visit: Admitting: Professional Counselor

## 2024-05-26 DIAGNOSIS — F4310 Post-traumatic stress disorder, unspecified: Secondary | ICD-10-CM

## 2024-05-26 DIAGNOSIS — F3162 Bipolar disorder, current episode mixed, moderate: Secondary | ICD-10-CM

## 2024-05-26 NOTE — Progress Notes (Signed)
  THERAPIST PROGRESS NOTE  Virtual Visit via Video Note  I connected with [name] on 05/26/24 at 12:00 PM EDT by a video enabled telemedicine application and verified that I am speaking with the correct person using two identifiers.  Location: Patient: Home Provider: Remote office   I discussed the limitations of evaluation and management by telemedicine and the availability of in person appointments. The patient expressed understanding and agreed to proceed.   I discussed the assessment and treatment plan with the patient. The patient was provided an opportunity to ask questions and all were answered. The patient agreed with the plan and demonstrated an understanding of the instructions.   The patient was advised to call back or seek an in-person evaluation if the symptoms worsen or if the condition fails to improve as anticipated.  I provided 60 minutes of non-face-to-face time during this encounter. Len Quale, [facility]  Session Time: 12:00 PM - 1:00 PM  Participation Level: Active  Behavioral Response: Well Groomed, Alert, Dysphoric  Type of Therapy: Group Therapy  Interventions: CBT and Supportive  Summary: [name] is a 38 y.o. female who presents with a history of bipolar disorder and PTSD. She appeared dysphoric but oriented x5. She reported things are good, but she's still struggling with her goal/emotion regulation. She noted she is 7 months pregnant now. [name] was engaged in group session. She reported she wants to continue working on her goal to reduce anger/anger reactions.   Therapist Response: Conducted telehealth group session. Began session with introductions and review of group rules and expectations. Engaged group members in mindfulness exercise - guided meditation "Leaves on a Stream." Conducted brief check-in with each group member. Reviewed handouts and worksheets - Mindfulness module - "How" skills - Nonjudgmentally, one-mindfully, and  effectively. Passenger transport manager with each group member and goal setting between now and next session.   Suicidal/Homicidal: No  Diagnosis: Bipolar 1 disorder, mixed, moderate (HCC)  PTSD (post-traumatic stress disorder)  Patient/Guardian was advised Release of Information must be obtained prior to any record release in order to collaborate their care with an outside provider. Patient/Guardian was advised if they have not already done so to contact the registration department to sign all necessary forms in order for us  to release information regarding their care.   Consent: Patient/Guardian gives verbal consent for treatment and assignment of benefits for services provided during this visit. Patient/Guardian expressed understanding and agreed to proceed.   Len Quale, [facility] 05/26/2024

## 2024-06-02 ENCOUNTER — Ambulatory Visit: Admitting: Professional Counselor

## 2024-06-02 DIAGNOSIS — F4310 Post-traumatic stress disorder, unspecified: Secondary | ICD-10-CM | POA: Diagnosis not present

## 2024-06-02 DIAGNOSIS — F3162 Bipolar disorder, current episode mixed, moderate: Secondary | ICD-10-CM

## 2024-06-03 NOTE — Progress Notes (Signed)
  THERAPIST PROGRESS NOTE  Virtual Visit via Video Note  I connected with [name] on 06/03/24 at 12:00 PM EDT by a video enabled telemedicine application and verified that I am speaking with the correct person using two identifiers.  Location: Patient: Home Provider: Remote office   I discussed the limitations of evaluation and management by telemedicine and the availability of in person appointments. The patient expressed understanding and agreed to proceed.   I discussed the assessment and treatment plan with the patient. The patient was provided an opportunity to ask questions and all were answered. The patient agreed with the plan and demonstrated an understanding of the instructions.   The patient was advised to call back or seek an in-person evaluation if the symptoms worsen or if the condition fails to improve as anticipated.  I provided 35 minutes of non-face-to-face time during this encounter. Len Quale, [facility]  Session Time: 12:00 PM - 12:35 PM  Participation Level: Minimal  Behavioral Response: Well Groomed, Alert, Irritable  Type of Therapy: Group Therapy  Interventions: CBT and Supportive  Summary: [name] is a 38 y.o. female who presents with a history of bipolar disorder and PTSD. She appeared irritable but oriented x5. She stated she has been trying to focus on her attitude but is still struggling. She reported even being "annoyed with myself." She disconnected from group session early and was not able to provide a new goal for the week.   Therapist Response: Conducted telehealth group session. Began session with introductions and review of group rules and expectations. Engaged group members in mindfulness exercise - guided meditation for nonjudgmental. Conducted brief check-in with each group member. Reviewed handouts and worksheets - Mindfulness module - Mindfulness from a spiritual perspective. Passenger transport manager with each group member and  goal setting between now and next session.   Suicidal/Homicidal: No  Diagnosis: Bipolar 1 disorder, mixed, moderate (HCC)  PTSD (post-traumatic stress disorder)  Patient/Guardian was advised Release of Information must be obtained prior to any record release in order to collaborate their care with an outside provider. Patient/Guardian was advised if they have not already done so to contact the registration department to sign all necessary forms in order for us  to release information regarding their care.   Consent: Patient/Guardian gives verbal consent for treatment and assignment of benefits for services provided during this visit. Patient/Guardian expressed understanding and agreed to proceed.   Len Quale, [facility] 06/03/2024

## 2024-06-05 ENCOUNTER — Ambulatory Visit: Admitting: Professional Counselor

## 2024-06-09 ENCOUNTER — Ambulatory Visit: Admitting: Professional Counselor

## 2024-06-11 DIAGNOSIS — D509 Iron deficiency anemia, unspecified: Secondary | ICD-10-CM | POA: Insufficient documentation

## 2024-06-16 ENCOUNTER — Ambulatory Visit: Admitting: Professional Counselor

## 2024-06-23 ENCOUNTER — Ambulatory Visit: Admitting: Professional Counselor

## 2024-07-05 LAB — OB RESULTS CONSOLE GBS: GBS: POSITIVE

## 2024-07-05 NOTE — Progress Notes (Signed)
 Obstetrics & Gynecology Office Visit  Subjective  [name] is a 38 y.o. [id number] at [redacted]w[redacted]d being seen today for ongoing prenatal care.  She is currently monitored for tthis high-risk pregnancy.  Patient's last menstrual period was 11/01/2023 (approximate). Estimated Date of Delivery: 08/07/24  History of Present Illness [name] is a 38 year old female who presents for routine prenatal appointment. Her last prenatal visit was on 04/26/2024. She states she's been home wit her kids and was unsure if she could bring them to appointments.   She is currently pregnant and has a history one previous c/section. She has had six previous pregnancies, including twins delivered via C-section, while the others were vaginal births. She is considering a C-section for her current pregnancy due to concerns about her history of uterine prolapse.  She experiences numbness and tingling in her arms, particularly when lying down, described as 'tingly' and 'numb'.  She has a history of anemia, with a past hemoglobin level of almost eleven, and is concerned about its impact on her pregnancy and delivery. She notes difficulty eating due to dental issues, which she believes affects her nutrition.    Pt denies contractions, vaginal bleeding, leaking fluid. Endorses good fetal movement Pt denies HA, VD or RUQ pain.   Objective  BP 109/76   Pulse 73   Ht 154.9 cm (5' 1)   Wt 82.6 kg (182 lb 3.2 oz)   LMP 11/01/2023 (Approximate)   BMI 34.43 kg/m    Fetal Status: Fetal Heart Rate: 145 bpm Fundal Height (cm): 34 cm Movement: Present    Pre-pregnant weight: 70.8 kg (156 lb)  TWG: 11.9 kg (26 lb 3.2 oz)  PE Chaperone note: A chaperone was included for sensitive portions of the exam- staff member name: Alan Hail, CMA Chaperone present for pelvic exam.  Gen: NAD  Pulm: No use of accessory muscles, normal respirations Abdomen: Gravid, nontender Ext : No edema, no rashes.   Psych: Mood, insight,  judgement intact SVE: deferred  Fundal height: S=D  Assessment   38 y.o. [id number] at [redacted]w[redacted]d by  08/07/2024, by Last Menstrual Period presenting for routine prenatal visit  The primary encounter diagnosis was Supervision of high risk pregnancy in third trimester (HHS-HCC). Diagnoses of Need for vaccination, Limited prenatal care in third trimester (HHS-HCC), AMA (advanced maternal age) multigravida 35+, second trimester (HHS-HCC), History of cesarean delivery, and Anemia affecting pregnancy in third trimester (HHS-HCC) were also pertinent to this visit.   Plan   Problem list reviewed and/or updated   Assessment & Plan Pregnancy Discussed C-section versus vaginal birth, noting C-section may not prevent prolapse worsening. Carpal tunnel symptoms likely due to pregnancy-related fluid retention, expected to improve postpartum. - Discussed risks and benefits of C-section versus vaginal birth. - Order ultrasound within 1-2 weeks. - Perform glucose screening test today. - Advise keeping arms at heart level or lower while sleeping for carpal tunnel symptoms. - Blood consent signed today - EPDS 9  - Encouraged to keep regular prenatal appointments and let us  know about potential barriers   Uterine Prolapse Discussed concerns about level two uterine prolapse and delivery options, noting C-section may not prevent worsening. - Discuss concerns about uterine prolapse and delivery options with MD at next appointment.  Anemia Discussed risks of anemia during delivery and potential effects on baby. Current status to be checked with lab work. - Check current anemia status with lab work. - Monitor hemoglobin levels and manage anemia as needed.  Tubal Ligation She desires  tubal ligation post-delivery. Discussed 30-day consent period requirement.  - Consent form signed today.  General Health Maintenance Received Tdap vaccination to reduce pertussis risk in newborn, noting significant risk  reduction. - Administer Tdap vaccine today.    Orders Placed This Encounter  Procedures  . Xpert CT/NG, PCR - [facility]    Release to patient:   Immediate  . Strep Gp B NAA  - [facility]    Release to patient:   Immediate  . US  OB follow up detailed transabdominal approach    [facility]    Standing Status:   Future    Expected Date:   07/05/2024    Expiration Date:   07/06/2025    Reason for Exam: (Free Text):   AMA, limited prenatal care    Release to patient:   Immediate  . Tdap (>=55YR) vaccine (Adacel/Boostrix)(No Medicare Patients)  . CBC w/auto Differential (5 Part)    Release to patient:   Immediate  . Hemoglobin A1C    If GDM needs at 36 weeks    Release to patient:   Immediate  . Ferritin    Release to patient:   Immediate  . Glucose Tolerance, OB/Glucola    Release to patient:   Immediate    Return in about 1 week (around 07/12/2024) for routine PNC, US  for growth/AFI with MD to discuss c/section and BTL.   Attestation Statement:   I personally performed the service, non-incident to. (WP)   ANNA MICHELLE MACKIE, CNM   This note has been created using automated tools and reviewed for accuracy by [facility].

## 2024-07-07 ENCOUNTER — Ambulatory Visit: Admitting: Professional Counselor

## 2024-07-08 DIAGNOSIS — B951 Streptococcus, group B, as the cause of diseases classified elsewhere: Secondary | ICD-10-CM | POA: Insufficient documentation

## 2024-07-08 DIAGNOSIS — O99013 Anemia complicating pregnancy, third trimester: Secondary | ICD-10-CM | POA: Insufficient documentation

## 2024-07-14 ENCOUNTER — Ambulatory Visit: Admitting: Professional Counselor

## 2024-07-19 NOTE — H&P (Signed)
 [facility] Labor and Delivery  Obstetrics Admission History and Physical  Chief Complaint: Emesis and Leg Pain (Left leg 8/10. Last 2 weeks. /)  Prenatal Provider: [facility]      History of Present Illness   [name] is a 38 y.o. [phone] at [redacted]w[redacted]d with an EDD of 08/07/2024, by Last Menstrual Period.   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  PTSD Bipolar disorder Depression Anxiety AMA H/o pre-eclampsia (G7) H/o ectopic pregnancy (2022) Chronic marijuana use Uterine prolapse (2nd degree) Anemia H/o cholecystectomy (2023)   The patient is presenting today for multiple concerns. She reports nausea/vomiting and diarrhea for the past 2-3 days as well as right leg pain worst at her hip, shooting down the lateral aspect. Denies redness, swelling, or pain in her calves.   She feels feverish and chilled. Has not taken her temperature at home. Endorses poor appetite with two episodes of dry-heaving today. Had true emesis yesterday x2, partially digested food. Also having diarrhea. No one else in the household is sick. She has not eaten anything out of the ordinary for her. Endorses poor appetite.   She reports crampy abdominal pain, unsure if contractions. Had been having midepigastric pain a few days ago but that has resolved. Denies leakage of fluid or vaginal bleeding. Feeling baby move well.   Feels like her hands and feet are more swollen. Feeling generalized malaise.   ROS   Review of systems as per HPI. All other systems reviewed and negative.   NIDA/TAPS screen: Negative.  Tobacco: Never  Alcohol: Never  Recreational drugs: Never     Prescription drugs: Never No indication to order urine drug screen.  Perinatal Information    ABO/RH:  O Positive Antibody:  Negative Rubella:  Immune Varicella:  Immune Syphilis: --/Non Reactive  (06/12 1053)  HepB:  Negative HepC:  Negative HIV: NonReactive  (10/24 1024)  Gonorrhea: Negative /--/--/-- (10/16 1055)  Chlamydia: Negative /--/--/-- (10/16 1055)  GBS: --Marzette /-- (07/09 0859)   Glucose OB/Glucola  (mg/dL)  Date Value  97/95/7978 108   Anatomy scan: 03/07/2024 at [redacted]w[redacted]d, cephalic presentation, anterior placenta, no evidence of previa, 3 vessel cord, normal AF, normal anatomy  Last U/S: 07/10/2024 at [redacted]w[redacted]d, EFW 2,769g (45%ile), AC 70%ile, cephalic, AFI 12.6 cm   Postpartum plans: No data recorded  History   OB History  Gravida Para Term Preterm AB Living  9 5 4 1 3 6   SAB IAB Ectopic Molar Multiple Live Births  1 1 1  1 3     # Outcome Date GA Lbr Len/2nd Weight Sex Type Anes PTL Lv  9 Current           8 Ectopic 07/09/21 [redacted]w[redacted]d            Complications: Ruptured left tubal ectopic pregnancy causing hemoperitoneum (HHS-HCC)  7 Term 04/11/20 [redacted]w[redacted]d 05:12 / 00:03 2.72 kg (5 lb 15.9 oz) F Vag-Spont EPI  LIV  6 Term 04/30/19 [redacted]w[redacted]d 03:13 / 00:10 2.75 kg (6 lb 1 oz) M Vag-Spont None  LIV  5 SAB 01/19/16 [redacted]w[redacted]d         4 IAB 06/28/14 [redacted]w[redacted]d         3 Term 03/13/09 [redacted]w[redacted]d  2.58 kg (5 lb 11 oz) F VBAC EPI  2A Preterm 11/28/06 [redacted]w[redacted]d  0.539 kg (1 lb 3 oz) M CS-LTranv  Y      Birth Comments: Active preterm labor, twins, malpresentation  2B Preterm 11/28/06 [redacted]w[redacted]d  0.539 kg (1 lb 3 oz) M CS-LTranv  Y   1 Term 02/11/06 [redacted]w[redacted]d  2.892 kg (6 lb 6 oz) F Vag-Spont EPI  LIV    Past Medical History:  Diagnosis Date  . Acne   . Anemia affecting pregnancy in third trimester (HHS-HCC) 07/08/2024  . Anxiety disorder   . Cervical dysplasia, mild 09/21/2011  . Depression   . Dermatitis   . Dysplasia of cervix, low grade (CIN 1)   . History of gestational hypertension 05/15/2020  . History of preterm delivery 11/27/2019   At 26 weeks with twins in setting of abdominal  trauma  . History of vaginal delivery following previous cesarean delivery 11/27/2019   Desires VBAC again.  . IUD migration 10/19/2011  . LSIL (low grade squamous intraepithelial lesion) on Pap smear   . Morbid obesity (CMS/HHS-HCC) 05/12/2023  . PTSD (post-traumatic stress disorder)   . Seasonal allergic rhinitis 05/19/2017  . STD (sexually transmitted disease) 2012     Past Surgical History:  Procedure Laterality Date  . CESAREAN SECTION  11/28/2006   PLTCS for PTL, twins, malpresentation  . PELVIC LAPAROSCOPY  10/23/2011   Removal of abdominal IUD  . Laparoscopic LSO,LOA and right ovarian cystectomy  07/09/2021  . Dx Lap with removal of ectopic pregnancy and left salpingectomy  07/09/2021  . EGD @ [facility]  05/19/2023   Gastritis/No repeat/SMR  . CHOLECYSTECTOMY  03/2022  . TUBAL LIGATION       Family History  Problem Relation Age of Onset  . No Known Problems Mother   . No Known Problems Father   . No Known Problems Sister   . No Known Problems Sister   . No Known Problems Sister   . No Known Problems Sister   . No Known Problems Sister   . No Known Problems Sister   . No Known Problems Sister   . No Known Problems Brother   . No Known Problems Brother   . No Known Problems Brother   . Diabetes type II Maternal Grandmother   . Diabetes type II Maternal Grandfather   . Diabetes type II Paternal Grandmother   . Diabetes type II Paternal Grandfather   . No Known Problems Daughter   . No Known Problems Daughter   . Hydrocephalus Son   . No Known Problems Son   . Coronary Artery Disease (Blocked arteries around heart) Neg Hx   . High blood pressure (Hypertension) Neg Hx      Social Drivers of Health with Concerns   Food Insecurity: No Food Insecurity (03/02/2024)   Hunger Vital Sign   . Worried About Programme researcher, broadcasting/film/video in the Last Year: Never true   . Ran Out of Food in the Last Year: Never true  Recent Concern: Food Insecurity - Food Insecurity Present  (12/17/2023)   Received from [facility]   Hunger Vital Sign   . Worried About Programme researcher, broadcasting/film/video in the Last Year: Sometimes true   . Ran Out of Food in the Last Year: Sometimes true  Stress: Stress Concern Present (12/23/2023)   Received from [facility] Occupational [facility] - Occupational Stress Questionnaire   . Feeling of Stress : Rather much  Social Connections: Socially Isolated (12/23/2023)   Received from [facility]  [facility]   Social Connection and Isolation Panel   . In a typical week, how many times do you talk on the phone with family, friends, or neighbors?: Once a week   . How often do you get together with friends or relatives?: Never   . How often do you attend church or religious services?: Never   . Do you belong to any clubs or organizations such as church groups, unions, fraternal or athletic groups, or school groups?: No   . How often do you attend meetings of the clubs or organizations you belong to?: Never   . Are you married, widowed, divorced, separated, never married, or living with a partner?: Separated  Housing Stability: Low Risk  (03/02/2024)   Housing Stability Vital Sign   . Unable to Pay for Housing in the Last Year: No   . Number of Times Moved in the Last Year: 0   . Homeless in the Last Year: No  Recent Concern: Housing Stability - High Risk (12/17/2023)   Received from [facility] Stability Vital Sign   . Unable to Pay for Housing in the Last Year: Yes   . Number of Times Moved in the Last Year: 0   . Homeless in the Last Year: No  Utilities: Not At Risk (03/02/2024)   [facility] Utilities   . Threatened with loss of utilities: No  Recent Concern: Utilities - At Risk (12/23/2023)   Received from [facility] Utilities   . Threatened with loss of utilities: Yes   Abuse/neglect Assessment: Is Anyone Hurting/Threatening You or Making You Feel Afraid? : No  Medications   Medications Prior to Admission  Medication Sig  Dispense Refill  . aspirin  81 MG EC tablet Take 81 mg by mouth    . ferrous sulfate  325 (65 FE) MG tablet Take 325 mg by mouth daily with breakfast    . prenatal vit-iron fum-folic ac (PRENAVITE) tablet Take 1 tablet by mouth once daily     Allergies   Allergies  Allergen Reactions  . Latex, Natural Rubber Other (See Comments)    Irritation with condoms   Objective   Vitals:   07/19/24 2030 07/19/24 2100 07/19/24 2158  BP: 121/66 121/81   Pulse: 67 66   Temp: 36.9 C (98.5 F)    TempSrc: Oral    SpO2:  98%   PainSc:     9     General: alert, cooperative, in NAD Cardiovascular: regular rate Respiratory: Normal respiratory effort Abdomen: gravid and soft, non-tender, without rebound or guarding Uterine Size: consistent with dates Neuro: alert and oriented x 4 and answers questions appropriately Psych: normal mood, behavior, speech, dress and thought processes, pt is a good historian, and anxious Extremities: grossly normal with no edema or cyanosis Pelvic: normal appearing vulva with no masses, tenderness or lesions Sterile Speculum Exam: deferred  Cervical Exam: Dilation: 3 Effacement (%): 50 Station: -2 Method: Manual Examiner: Shelby MD Posterior    Estimated Fetal Weight: 3200 g, cephalic by BSUS  Electronic Fetal Monitoring:           Fetal Heart Tones: baseline 120 bpm / moderate variability / +15x15 accelerations / no decelerations   Toco: quiet  Interpretation: reactive  Assessment and Plan   [name] is a 38 y.o. [id number] at [redacted]w[redacted]d with an EDD of 08/07/2024, by Last Menstrual Period  who presents for 2-3 days of nausea/vomiting and generalized malaise.  Plan by Problem:  Nausea/vomiting Diarrhea  Generalized malaise Differential includes gastroenteritis, pancreatitis, appendicitis, food poisoning. Patient is s/p cholecystectomy so not cholecystitis/cholelithiasis. Work-up notable for elevated amylase (136), though normal lipase. Patient is  reassuringly afebrile with normal WBC and tolerating PO with last true episode of emesis yesterday. Electrolytes are within normal limits.  - Had planned to pursue RUQ US  +/- CT of pancreas for rule out pancreatitis given mildly elevated amylase but patient left AMA without notifying anyone on the team. Patient was contacted and stated she did not want to waste anyone's time. Was reassured that she was not wasting anyone's time and she should return for care.  Evaluation of labor, term - Reports intermittent crampy abdominal pain, not patterned. Denies LOF, VB. Endorses good fetal movement.  - Toco without regular contractions - SVE 3/50/-2 - The patient is having regular contractions and is dilated 2-4 cm.  We recommend repeat cervical exam in 1-2 hours to assess cervical change. Unable to repeat exam as patient left AMA without notifying anyone on the team, as above.  FWB  - NST: reactive  - GBS: positive    Disposition: Patient departed AMA before completion of work-up. ----------------------------------------------- Level of Interpreter Services: No interpreter needed (no language barrier) SHELBY NICOLE DAVIS-COOPER, MD  07/19/2024  Next OB Appointment: Future Appointments  Date Time Provider Department Center  07/19/2024 10:25 PM [facility]   ------------------------------------------------------------------------------- Attestation signed by Sandre Emerick Norris, MD at 07/20/2024  1:03 AM Attestation Statement:   This service was rendered under my overall direction and control, and I was immediately available via phone/pager or present on site.  MOLLIE NORRIS SANDRE, MD  -------------------------------------------------------------------------------

## 2024-07-19 NOTE — Progress Notes (Signed)
 Pt discovered to be out of the room at 2320. This nurse had just taken over care and understood the pt had an ultrasound scheduled. At 2345, transport arrived to collect pt for ultrasound. Confirmed pt was not already downstairs for testing. Charge nurse called pt, unable to reach, so called alternate number for [facility]. Ms. Dionisio confirmed pt had informed her that she was discharged, charge RN asked that Ms. Flores request the pt call the unit to verify she had left. Pt stated she had removed the IV and put a bandaid on the site. Per Consulting civil engineer, pt stated that she did not want to waste anyone's time. Charge RN assured pt that she was not wasting anyone's time and that if she should need to return for our care she could at any time. This RN informed Resident MD of pt's departure.

## 2024-07-21 ENCOUNTER — Ambulatory Visit: Admitting: Professional Counselor

## 2024-07-25 ENCOUNTER — Other Ambulatory Visit: Payer: Self-pay

## 2024-07-25 ENCOUNTER — Encounter: Payer: Self-pay | Admitting: Anesthesiology

## 2024-07-25 ENCOUNTER — Encounter: Payer: Self-pay | Admitting: Obstetrics and Gynecology

## 2024-07-25 ENCOUNTER — Inpatient Hospital Stay
Admission: EM | Admit: 2024-07-25 | Discharge: 2024-07-26 | DRG: 806 | Disposition: A | Discharge status: Home / Self Care | Attending: Obstetrics and Gynecology | Admitting: Obstetrics and Gynecology

## 2024-07-25 DIAGNOSIS — F419 Anxiety disorder, unspecified: Secondary | ICD-10-CM | POA: Diagnosis present

## 2024-07-25 DIAGNOSIS — O99324 Drug use complicating childbirth: Secondary | ICD-10-CM | POA: Diagnosis present

## 2024-07-25 DIAGNOSIS — Z3A38 38 weeks gestation of pregnancy: Secondary | ICD-10-CM

## 2024-07-25 DIAGNOSIS — Z3490 Encounter for supervision of normal pregnancy, unspecified, unspecified trimester: Principal | ICD-10-CM

## 2024-07-25 DIAGNOSIS — Z833 Family history of diabetes mellitus: Secondary | ICD-10-CM

## 2024-07-25 DIAGNOSIS — O34219 Maternal care for unspecified type scar from previous cesarean delivery: Principal | ICD-10-CM | POA: Diagnosis present

## 2024-07-25 DIAGNOSIS — F1290 Cannabis use, unspecified, uncomplicated: Secondary | ICD-10-CM | POA: Diagnosis present

## 2024-07-25 DIAGNOSIS — O99824 Streptococcus B carrier state complicating childbirth: Secondary | ICD-10-CM | POA: Diagnosis present

## 2024-07-25 DIAGNOSIS — O26893 Other specified pregnancy related conditions, third trimester: Secondary | ICD-10-CM | POA: Diagnosis present

## 2024-07-25 DIAGNOSIS — O99344 Other mental disorders complicating childbirth: Secondary | ICD-10-CM | POA: Diagnosis present

## 2024-07-25 LAB — TYPE AND SCREEN
ABO/RH(D): O POS
Antibody Screen: NEGATIVE

## 2024-07-25 LAB — CBC
HCT: 31.8 % — ABNORMAL LOW (ref 36.0–46.0)
HCT: 32.3 % — ABNORMAL LOW (ref 36.0–46.0)
Hemoglobin: 11.1 g/dL — ABNORMAL LOW (ref 12.0–15.0)
Hemoglobin: 11 g/dL — ABNORMAL LOW (ref 12.0–15.0)
MCH: 32.2 pg (ref 26.0–34.0)
MCH: 31.6 pg (ref 26.0–34.0)
MCHC: 34.9 g/dL (ref 30.0–36.0)
MCHC: 34.1 g/dL (ref 30.0–36.0)
MCV: 92.2 fL (ref 80.0–100.0)
MCV: 92.8 fL (ref 80.0–100.0)
Platelets: 283 K/uL (ref 150–400)
Platelets: 277 K/uL (ref 150–400)
RBC: 3.45 MIL/uL — ABNORMAL LOW (ref 3.87–5.11)
RBC: 3.48 MIL/uL — ABNORMAL LOW (ref 3.87–5.11)
RDW: 14.8 % (ref 11.5–15.5)
RDW: 14.7 % (ref 11.5–15.5)
WBC: 20.6 K/uL — ABNORMAL HIGH (ref 4.0–10.5)
WBC: 25.5 K/uL — ABNORMAL HIGH (ref 4.0–10.5)
nRBC: 0 % (ref 0.0–0.2)
nRBC: 0 % (ref 0.0–0.2)

## 2024-07-25 LAB — RAPID HIV SCREEN (HIV 1/2 AB+AG)
HIV 1/2 Antibodies: NONREACTIVE
HIV-1 P24 Antigen - HIV24: NONREACTIVE

## 2024-07-25 LAB — RPR: RPR Ser Ql: NONREACTIVE

## 2024-07-25 MED ORDER — FENTANYL CITRATE (PF) 100 MCG/2ML IJ SOLN
50.0000 ug | INTRAMUSCULAR | Status: DC | PRN
  Administered 2024-07-25: 50 ug via INTRAVENOUS
  Filled 2024-07-25: qty 2

## 2024-07-25 MED ORDER — OXYTOCIN BOLUS FROM INFUSION
333.0000 mL | Freq: Once | INTRAVENOUS | Status: AC
  Administered 2024-07-25: 333 mL via INTRAVENOUS

## 2024-07-25 MED ORDER — SOD CITRATE-CITRIC ACID 500-334 MG/5ML PO SOLN: 30.0000 mL | ORAL | Status: DC | PRN

## 2024-07-25 MED ORDER — COCONUT OIL OIL: 1.0000 | TOPICAL_OIL | Status: DC | PRN

## 2024-07-25 MED ORDER — WITCH HAZEL-GLYCERIN EX PADS: 1.0000 | MEDICATED_PAD | CUTANEOUS | Status: DC | PRN

## 2024-07-25 MED ORDER — ACETAMINOPHEN 325 MG PO TABS
650.0000 mg | ORAL_TABLET | ORAL | Status: DC | PRN
  Administered 2024-07-25: 650 mg via ORAL
  Filled 2024-07-25: qty 2

## 2024-07-25 MED ORDER — FENTANYL-BUPIVACAINE-NACL 0.5-0.125-0.9 MG/250ML-% EP SOLN: 12.0000 mL/h | EPIDURAL | Status: DC | PRN

## 2024-07-25 MED ORDER — OXYTOCIN-SODIUM CHLORIDE 30-0.9 UT/500ML-% IV SOLN
2.5000 [IU]/h | INTRAVENOUS | Status: DC
  Filled 2024-07-25: qty 500

## 2024-07-25 MED ORDER — LACTATED RINGERS IV SOLN: 500.0000 mL | INTRAVENOUS | Status: DC | PRN

## 2024-07-25 MED ORDER — ONDANSETRON HCL 4 MG/2ML IJ SOLN
4.0000 mg | Freq: Four times a day (QID) | INTRAMUSCULAR | Status: DC | PRN
  Administered 2024-07-25: 4 mg via INTRAVENOUS
  Filled 2024-07-25: qty 2

## 2024-07-25 MED ORDER — SENNOSIDES-DOCUSATE SODIUM 8.6-50 MG PO TABS
2.0000 | ORAL_TABLET | ORAL | Status: DC
  Administered 2024-07-25 – 2024-07-26 (×2): 2 via ORAL
  Filled 2024-07-25 (×2): qty 2

## 2024-07-25 MED ORDER — METHYLERGONOVINE MALEATE 0.2 MG/ML IJ SOLN
INTRAMUSCULAR | Status: AC
  Filled 2024-07-25: qty 1

## 2024-07-25 MED ORDER — IBUPROFEN 600 MG PO TABS
600.0000 mg | ORAL_TABLET | Freq: Four times a day (QID) | ORAL | Status: DC
  Administered 2024-07-25 – 2024-07-26 (×6): 600 mg via ORAL
  Filled 2024-07-25 (×6): qty 1

## 2024-07-25 MED ORDER — MAGNESIUM HYDROXIDE 400 MG/5ML PO SUSP: 30.0000 mL | ORAL | Status: DC | PRN

## 2024-07-25 MED ORDER — PHENYLEPHRINE 80 MCG/ML (10ML) SYRINGE FOR IV PUSH (FOR BLOOD PRESSURE SUPPORT): 80.0000 ug | PREFILLED_SYRINGE | INTRAVENOUS | Status: DC | PRN

## 2024-07-25 MED ORDER — MEASLES, MUMPS & RUBELLA VAC IJ SOLR: 0.5000 mL | INTRAMUSCULAR | Status: DC | PRN

## 2024-07-25 MED ORDER — SODIUM CHLORIDE 0.9 % IV SOLN: 2.0000 g | Freq: Once | INTRAVENOUS | Status: DC

## 2024-07-25 MED ORDER — LACTATED RINGERS IV SOLN: 500.0000 mL | Freq: Once | INTRAVENOUS | Status: DC

## 2024-07-25 MED ORDER — EPHEDRINE 5 MG/ML INJ: 10.0000 mg | INTRAVENOUS | Status: DC | PRN

## 2024-07-25 MED ORDER — BENZOCAINE-MENTHOL 20-0.5 % EX AERO
1.0000 | INHALATION_SPRAY | CUTANEOUS | Status: DC | PRN
Start: 2024-07-25 — End: 2024-07-26

## 2024-07-25 MED ORDER — FERROUS SULFATE 325 (65 FE) MG PO TABS
325.0000 mg | ORAL_TABLET | Freq: Two times a day (BID) | ORAL | Status: DC
  Administered 2024-07-25 – 2024-07-26 (×3): 325 mg via ORAL
  Filled 2024-07-25 (×3): qty 1

## 2024-07-25 MED ORDER — OXYCODONE-ACETAMINOPHEN 5-325 MG PO TABS: 2.0000 | ORAL_TABLET | ORAL | Status: DC | PRN

## 2024-07-25 MED ORDER — SIMETHICONE 80 MG PO CHEW: 80.0000 mg | CHEWABLE_TABLET | ORAL | Status: DC | PRN

## 2024-07-25 MED ORDER — AMPICILLIN SODIUM 2 G IJ SOLR: 2.0000 g | Freq: Once | INTRAMUSCULAR | Status: DC

## 2024-07-25 MED ORDER — SODIUM CHLORIDE 0.9 % IV SOLN
1.0000 g | INTRAVENOUS | Status: DC
  Filled 2024-07-25 (×3): qty 1000

## 2024-07-25 MED ORDER — FENTANYL-BUPIVACAINE-NACL 0.5-0.125-0.9 MG/250ML-% EP SOLN
EPIDURAL | Status: AC
  Filled 2024-07-25: qty 250

## 2024-07-25 MED ORDER — OXYCODONE-ACETAMINOPHEN 5-325 MG PO TABS
1.0000 | ORAL_TABLET | ORAL | Status: DC | PRN
  Administered 2024-07-25: 1 via ORAL
  Filled 2024-07-25: qty 1

## 2024-07-25 MED ORDER — ZOLPIDEM TARTRATE 5 MG PO TABS: 5.0000 mg | ORAL_TABLET | Freq: Every evening | ORAL | Status: DC | PRN

## 2024-07-25 MED ORDER — LIDOCAINE HCL (PF) 1 % IJ SOLN
30.0000 mL | INTRAMUSCULAR | Status: DC | PRN
  Filled 2024-07-25: qty 30

## 2024-07-25 MED ORDER — LACTATED RINGERS IV SOLN: INTRAVENOUS | Status: AC

## 2024-07-25 MED ORDER — SODIUM CHLORIDE 0.9 % IV SOLN
INTRAVENOUS | Status: AC
  Filled 2024-07-25: qty 2000

## 2024-07-25 MED ORDER — LACTATED RINGERS IV SOLN: INTRAVENOUS | Status: DC

## 2024-07-25 MED ORDER — DIBUCAINE (PERIANAL) 1 % EX OINT: 1.0000 | TOPICAL_OINTMENT | CUTANEOUS | Status: DC | PRN

## 2024-07-25 MED ORDER — PRENATAL MULTIVITAMIN CH
1.0000 | ORAL_TABLET | Freq: Every day | ORAL | Status: DC
  Administered 2024-07-25: 1 via ORAL
  Filled 2024-07-25 (×2): qty 1

## 2024-07-25 MED ORDER — AMPICILLIN SODIUM 2 G IJ SOLR
2.0000 g | Freq: Once | INTRAMUSCULAR | Status: AC
  Administered 2024-07-25: 2 g via INTRAVENOUS

## 2024-07-25 MED ORDER — DIPHENHYDRAMINE HCL 25 MG PO CAPS: 25.0000 mg | ORAL_CAPSULE | Freq: Four times a day (QID) | ORAL | Status: DC | PRN

## 2024-07-25 MED ORDER — ONDANSETRON HCL 4 MG PO TABS: 4.0000 mg | ORAL_TABLET | ORAL | Status: DC | PRN

## 2024-07-25 MED ORDER — IBUPROFEN 600 MG PO TABS
ORAL_TABLET | ORAL | Status: AC
  Filled 2024-07-25: qty 1

## 2024-07-25 MED ORDER — SENNOSIDES-DOCUSATE SODIUM 8.6-50 MG PO TABS: 2.0000 | ORAL_TABLET | ORAL | Status: DC

## 2024-07-25 MED ORDER — DIPHENHYDRAMINE HCL 50 MG/ML IJ SOLN: 12.5000 mg | INTRAMUSCULAR | Status: DC | PRN

## 2024-07-25 MED ORDER — ONDANSETRON HCL 4 MG/2ML IJ SOLN: 4.0000 mg | INTRAMUSCULAR | Status: DC | PRN

## 2024-07-25 MED ORDER — ACETAMINOPHEN 325 MG PO TABS: 650.0000 mg | ORAL_TABLET | ORAL | Status: DC | PRN

## 2024-07-25 NOTE — Discharge Summary (Signed)
 Obstetrical Discharge Summary  Patient Name: [name] DOB: August 06, 1986 MRN: [id number]  Date of Admission: 07/25/2024 Date of Delivery:07/25/24 Delivered by: [name] Date of Discharge: 07/26/24  Primary OB: [name] OFE:Ejupzwu'd last menstrual period was 11/01/2023 (exact date). 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  PTSD Bipolar disorder Depression Anxiety AMA H/o pre-eclampsia (G7) H/o ectopic pregnancy (2022) Chronic marijuana use Uterine prolapse (2nd degree) Anemia H/o cholecystectomy (2023)     Admitting Diagnosis:  Secondary Diagnosis: Patient Active Problem List   Diagnosis Date Noted   VBAC, delivered 07/26/2024   Anxiety disorder 07/26/2024   Depression 07/26/2024   Anemia affecting pregnancy in third trimester 07/08/2024   Positive GBS test 07/08/2024   Iron deficiency anemia 06/11/2024   Leukorrhea 04/25/2024   Abnormal maternal serum screening test 03/22/2024   Pregnancy with history of cesarean section, antepartum 03/22/2024   History of pre-eclampsia in prior pregnancy, currently pregnant 03/22/2024   AMA (advanced maternal age) multigravida 35+ 03/22/2024   Family history of congenital hydrocephalus-son 03/22/2024   Encounter for supervision of high risk pregnancy in second trimester, antepartum 02/29/2024   Grand multiparity 01/28/2024   Hx successful VBAC (vaginal birth after cesarean), currently pregnant 01/28/2024   History of preterm delivery, currently pregnant 01/28/2024   Supervision of other normal pregnancy, antepartum 12/23/2023   PTSD (post-traumatic stress disorder) 12/04/2023   Bipolar and related disorder (HCC) 12/04/2023    Cannabis use disorder, moderate, dependence (HCC) 12/04/2023   High risk medication use 12/04/2023   Abnormal cervical Papanicolaou smear 05/12/2023   Marital problems 05/12/2023   Morbid obesity (HCC) 05/12/2023   Calculus of gallbladder with cholecystitis without biliary obstruction 02/03/2022    Augmentation: N/A Complications: None Intrapartum complications/course:  Date of Delivery:  Delivered By: [name] Delivery Type: vaginal birth after cesarean (VBAC) Anesthesia: none  Placenta: spontaneous Laceration:  Episiotomy: none Newborn Data: Live born female  Birth Weight: 6 lb 3.1 oz (2810 g) APGAR: 8, 9  Newborn Delivery   Birth date/time: 07/25/2024 03:31:00 Delivery type: Vaginal, Spontaneous       Postpartum Procedures: none  Edinburgh:     07/26/2024    6:02 AM 12/23/2023    8:37 AM  Van Postnatal Depression Scale Screening Tool  I have been able to laugh and see the funny side of things. 0 2  I have looked forward with enjoyment to things. 0 3  I have blamed myself unnecessarily when things went wrong. 1 3  I have been anxious or worried for no good reason. 1 2  I have felt scared or panicky for no good reason. 0 1  Things have been getting on top of me. 0 1  I have been so unhappy that I have had difficulty sleeping. 0 0  I have felt sad or miserable. 0 2  I have been so unhappy that I have been crying. 0 0  The thought of harming myself has occurred to me. 0 0  Edinburgh Postnatal Depression Scale Total 2 14      Data saved with a previous flowsheet row definition    Post partum course:  Patient had an uncomplicated postpartum course.  By time of discharge on PPD#1, her pain was  controlled on oral pain medications; she had appropriate lochia and was ambulating, voiding without difficulty and tolerating regular diet.  She was deemed stable for discharge to home.     Discharge Physical Exam:  BP 129/77 (BP Location: Left Arm)   Pulse  63   Temp 98 F (36.7 C) (Oral)   Resp 18   Ht 5' 1 (1.549 m)   Wt 84.4 kg   LMP 11/01/2023 (Exact Date)   SpO2 100%   Breastfeeding Unknown   BMI 35.14 kg/m   General: NAD CV: RRR Pulm: CTABL, nl effort ABD: s/nd/nt, fundus firm and below the umbilicus Lochia: moderate Incision: c/d/i DVT Evaluation: LE non-ttp, no evidence of DVT on exam.  Hemoglobin  Date Value Ref Range Status  07/25/2024 11.0 (L) 12.0 - 15.0 g/dL Final  98/69/7974 87.1 11.1 - 15.9 g/dL Final   HCT  Date Value Ref Range Status  07/25/2024 32.3 (L) 36.0 - 46.0 % Final   Hematocrit  Date Value Ref Range Status  01/27/2024 37.4 34.0 - 46.6 % Final     Disposition: stable, discharge to home. Baby Feeding: breastmilk Baby Disposition: home with mom  Rh Immune globulin given:  Rubella vaccine given:  Tdap vaccine given in AP or PP setting:  Flu vaccine given in AP or PP setting:   Contraception: medicaid papers signed 07/05/24 Interval BTL ( appt with [name] at 4 weeks PP)  Prenatal Labs:   ABO/RH:  O Positive Antibody:  Negative Rubella:  Immune Varicella:  Immune Syphilis: --/Non Reactive  (06/12 1053)  HepB:  Negative HepC:  Negative HIV: NonReactive  (10/24 1024)  Gonorrhea: Negative /--/--/-- (10/16 1055)  Chlamydia: Negative /--/--/-- (10/16 1055)  GBS: --Jackie /-- (07/09 0859)     Plan:  [name] was discharged to home in good condition. Follow-up appointment with delivering provider in 6 weeks.  Discharge Medications: Allergies as of 07/26/2024   No Known Allergies      Medication List     STOP taking these medications    aspirin  EC 81 MG tablet   Doxylamine -Pyridoxine  10-10 MG Tbec   IRON PO   lamoTRIgine  25 MG tablet Commonly known as: LaMICtal        TAKE these medications    hydrOXYzine  25 MG tablet Commonly known as: ATARAX  Take 12.5-25 mg by mouth 3 (three) times daily as needed for anxiety.   multivitamin tablet Take 1 tablet by mouth  daily.   Vitamin D 125 MCG (5000 UT) Caps Take 1 capsule by mouth daily.         Follow-up Information     [name], Jackie PARAS, Romero. Schedule an appointment as soon as possible for a visit in 2 week(s).   Specialty: Obstetrics and Gynecology Why: for a mood check and to arrange BTL Contact information: [address] [place] [address] [phone]                 Signed: Margery FORBES Coe 07/26/2024 8:52 AM

## 2024-07-25 NOTE — H&P (Signed)
 [name] is a 38 y.o. female presenting for active labor 38+1 weeks . 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  PTSD Bipolar disorder Depression Anxiety AMA H/o pre-eclampsia (G7) H/o ectopic pregnancy (2022) Chronic marijuana use Uterine prolapse (2nd degree) Anemia H/o cholecystectomy (2023)     Past Medical History:  Diagnosis Date   Anxiety    Depression    Uterine prolapse    second degree in 2021   Past Surgical History:  Procedure Laterality Date   CESAREAN SECTION     CHOLECYSTECTOMY     DIAGNOSTIC LAPAROSCOPY WITH REMOVAL OF ECTOPIC PREGNANCY Left 07/09/2021   Procedure: DIAGNOSTIC LAPAROSCOPY WITH REMOVAL OF ECTOPIC PREGNANCY; LEFT SALPINGECTOMY;  Surgeon: Lovetta Debby PARAS, MD;  Location: [facility] ORS;  Service: Gynecology;  Laterality: Left;   LAPAROSCOPIC OVARIAN CYSTECTOMY Right 07/09/2021   Procedure: LAPAROSCOPIC OVARIAN CYSTECTOMY;  Surgeon: Anishka Bushard, Debby PARAS, MD;  Location: [facility] ORS;  Service: Gynecology;  Laterality: Right;   WISDOM TOOTH EXTRACTION  2017   three;   Family History: family history includes Alzheimer's disease in her paternal grandfather; Bipolar disorder in her maternal aunt; Cancer (age of onset: 44) in her maternal grandmother; Diabetes in her maternal grandfather, maternal grandmother, mother, paternal grandfather, and paternal grandmother; Drug abuse in her father; Healthy in her brother, brother, brother, sister, sister, sister, sister, sister, sister, and sister; Heart Problems in her  maternal grandfather and maternal grandmother. Social History:  reports that she has never smoked. She has never used smokeless tobacco. She reports that she does not currently use alcohol. She reports that she does not currently use drugs after having used the following drugs: Marijuana.     Maternal Diabetes: No, unable to completer ---> A1c 5.6% Genetic Screening: Abnormal:  Results: Other:chromosopmal 18 abnormality , ? Fetal vs maternal origin  Maternal Ultrasounds/Referrals: Normal Fetal Ultrasounds or other Referrals:  None Maternal Substance Abuse:  Yes:  Type: Marijuana Significant Maternal Medications:  Meds include: Other: asa 81 mg  Significant Maternal Lab Results:  Group B Strep positive Number of Prenatal Visits:greater than 3 verified prenatal visits Maternal Vaccinations:TDap Other Comments:  None  Review of Systems History Dilation: 10 Effacement (%): 90 Station: Plus 2 Exam by:: JDaley Blood pressure (!) 146/92, pulse 78, temperature 98.2 F (36.8 C), temperature source Oral, resp. rate 16, height 5' 1 (1.549 m), weight 84.4 kg, last menstrual period 11/01/2023. Exam Physical Exam  Lungs CTA   CV RRR  Abd: gravid  Cx on admission 5 cm / c0 vtx Prenatal labs: ABO, Rh: --/--/O POS (07/29 0214) Antibody: NEG (07/29 0214) Rubella: 1.60 (01/30 1607) RPR: Non Reactive (01/30 1607)  HBsAg: Negative (01/30 1607)  HIV: NON REACTIVE (07/29 0214)  GBS:   Positive   Assessment/Plan: Admit   Anticipate VBAC PP will need genetic testing on mother and baby  Desires PP BTL , but papers filled out 07/05/24- will have to do as interval   Start ampicillin  for gbs prophylaxis   Debby PARAS Layza Summa 07/25/2024, 4:00 AM

## 2024-07-25 NOTE — Progress Notes (Signed)
 Post Partum Day 0  Subjective: Doing well, no concerns. Ambulating without difficulty, pain managed with PO meds, tolerating regular diet, and voiding without difficulty.   No fever/chills, chest pain, shortness of breath, nausea/vomiting, or leg pain. No nipple or breast pain. No headache, visual changes, or RUQ/epigastric pain.  Objective: BP 111/62 (BP Location: Left Arm)   Pulse (!) 57   Temp 98.7 F (37.1 C) (Oral)   Resp 19   Ht 5' 1 (1.549 m)   Wt 84.4 kg   LMP 11/01/2023 (Exact Date)   SpO2 97%   Breastfeeding Unknown   BMI 35.14 kg/m    Physical Exam:  General: alert and cooperative Breasts: soft/nontender CV: RRR Pulm: nl effort Abdomen: soft, non-tender Uterine Fundus: firm Incision: n/a Perineum: minimal edema, intact Lochia: appropriate DVT Evaluation: No evidence of DVT seen on physical exam. Edinburgh:     12/23/2023    8:37 AM  Van Postnatal Depression Scale Screening Tool  I have been able to laugh and see the funny side of things. 2  I have looked forward with enjoyment to things. 3  I have blamed myself unnecessarily when things went wrong. 3  I have been anxious or worried for no good reason. 2  I have felt scared or panicky for no good reason. 1  Things have been getting on top of me. 1  I have been so unhappy that I have had difficulty sleeping. 0  I have felt sad or miserable. 2  I have been so unhappy that I have been crying. 0  The thought of harming myself has occurred to me. 0  Edinburgh Postnatal Depression Scale Total 14      Data saved with a previous flowsheet row definition     Recent Labs    07/25/24 0214 07/25/24 0542  HGB 11.1* 11.0*  HCT 31.8* 32.3*  WBC 20.6* 25.5*  PLT 283 277    Assessment/Plan: 38 y.o. [id number] postpartum day # 1  1. Continue routine postpartum care  2. Infant feeding status: breast feeding -Lactation consult PRN for breastfeeding   3. Contraception plan: TBD  4. Acute blood loss  anemia - clinically not significant .  -Hemodynamically stable and asymptomatic -Intervention: continue on oral supplementation with ferrous sulfate  325  5. Immunization status:   all immunizations up to date  Disposition: Continue inpatient postpartum care    LOS: 0 days   Noe Pittsley, CNM 07/25/2024, 4:30 PM

## 2024-07-25 NOTE — Lactation Note (Signed)
 This note was copied from a baby's chart. Lactation Consultation Note  Patient Name: [name] Unijb'd Date: 07/25/2024 Age:37 hours Reason for consult: Initial assessment;Early term 37-38.6wks   Maternal Data Has patient been taught Hand Expression?: Yes Does the patient have breastfeeding experience prior to this delivery?: Yes How long did the patient breastfeed?: 6 months for all children  Initial assessment w/ a 10hr old baby Jackie and an experienced breastfeeding patient.  This was a SVD.  Patient w/ a hx of c-section, PTSD, biopolar disorder, anxiety/depression, pre-eclampsia (G7), and AMA.    Mom stated that she breastfed all her children for 6 months with no issues.  Patient has a Zoomie pump at home but is interested in a manual pump.    Feeding Mother's Current Feeding Choice: Breast Milk  Lactation Tools Discussed/Used Education on how to use the Medela Manual pump will be provided to patient prior to discharging.   Interventions Interventions: Breast feeding basics reviewed;Education  LC provided education on the following;  milk production expectations, hunger cues, day 1/2 wet/dirty diapers, hand expression, cluster feeding, benefits of STS and arousing infant for a feeding.  Lactation informed patient of feeding infant at least 8 or more times w/in a 24hr period but not exceeding 3hrs. Patient verbalized understanding.   Discharge Pump: Personal;Hands [name] (Zoomie) WIC Program: No (Would like to register w/ [facility].)  Consult Status Consult Status: Follow-up Follow-up type: In-patient    [name] S [name] 07/25/2024, 1:52 PM

## 2024-07-25 NOTE — Discharge Instructions (Signed)
 Agency Name: [facility] Address: [address] [place], [place] [address] Phone: [phone] Website: [email or web address] Service(s) Offered: Housing services, self-sufficiency, congregate meal program, and individual development account program.  Agency Name: [employer] Address: [address] [place], [place] [address] Phone: [phone] Email: [email or web address] Website: [email or web address] Service(s) Offered: Housing the homeless, feeding the hungry, Company secretary, job and education related services.  Agency Name: [facility] Address: [address], [place], [place] [address] Phone: [phone] Email: [email or web address] Service(s) Offered: Counseling, problem pregnancy, advocacy for Hispanics, limited emergency financial assistance.  Agency Name: Department [employer] Address: [address] [place], [place] [address] Phone: [phone] Website: [email or web address] Service(s) Offered: Child support services; child welfare services; SNAP; Medicaid; work first family assistance; and aid with fuel,  rent, food and medicine.  Agency Name: [employer] Address: [address], [place], [place] [address] Phone: [phone] or [phone] Email: [email or web address] Service(s) Offered: Family services and transient assistance; emergency food, fuel, clothing, limited furniture, utilities; budget counseling, general counseling; give a kid a coat; thrift store; Christmas food and toys. Utility assistance, food pantry, rental  assistance, life sustaining medicine

## 2024-07-26 DIAGNOSIS — O34219 Maternal care for unspecified type scar from previous cesarean delivery: Principal | ICD-10-CM | POA: Diagnosis not present

## 2024-07-26 DIAGNOSIS — F32A Depression, unspecified: Secondary | ICD-10-CM | POA: Insufficient documentation

## 2024-07-26 DIAGNOSIS — F419 Anxiety disorder, unspecified: Secondary | ICD-10-CM | POA: Insufficient documentation

## 2024-07-26 NOTE — Progress Notes (Signed)
 Tech entered room to get pt VS; RN entered a few moments afterwards; pt laying on her side with baby right up next to her; RN asked tech if pt was asleep when she entered the room and tech nodded yes; RN educated pt on infant safe sleep; RN reminded pt why we don't recommend co-sleeping with her infant; RN asked pt if she ever needed assistance moving baby from her bed to the bassinet to please call; pt said ok

## 2024-07-26 NOTE — Lactation Note (Signed)
 This note was copied from a baby's chart. Lactation Consultation Note  Patient Name: [name] Date: 07/26/2024 Age:37 hours Reason for consult: Follow-up assessment;Early term 37-38.6wks;Other (Comment) (Discharge Education)   Maternal Data LC to room to provide discharge education w/ a 28hr old baby Jackie and patient.  Mom stated that she is ready to go home but thinks feedings are going well.    Feeding Mother's Current Feeding Choice: Breast Milk  Patient latched infant independently while LC was in room providing education . Infant has wide open gape, actively feeding with audible swallows heard.  LC provided a tip to patient to bring infant closer in for the feeding.  LATCH Score Latch: Grasps breast easily, tongue down, lips flanged, rhythmical sucking.  Audible Swallowing: Spontaneous and intermittent  Type of Nipple: Everted at rest and after stimulation  Comfort (Breast/Nipple): Soft / non-tender  Hold (Positioning): No assistance needed to correctly position infant at breast.  LATCH Score: 10  Interventions Interventions: Assisted with latch;Education;CDC milk storage guidelines  Infant has had more than appropriate wet/dirty diapers for day one.  Reminded mom to continue to feed infant on demand.   Discharge Discharge Education: Engorgement and breast care;Warning signs for feeding baby;Outpatient recommendation  Education on engorgement prevention/treatment was discussed as well as breastmilk storage guidelines.  LC provided patient with a handout on breastmilk storage guidelines from [facility]. [facility] outpatient lactation services phone number written on the white board in the room.  Patient verbalized understanding.  LC also provided education from the postpartum book about warning signs to look for in a poor feeding.    Consult Status Consult Status: Complete Follow-up type: Call as needed    Ricky RAMAN Ramy Greth 07/26/2024, 9:23 AM

## 2024-07-26 NOTE — Progress Notes (Signed)
 Patient discharged. Discharge instructions given. Patient verbalizes understanding. Transported by axillary.

## 2024-08-04 ENCOUNTER — Ambulatory Visit: Admitting: Professional Counselor

## 2024-08-05 ENCOUNTER — Telehealth: Payer: Self-pay | Admitting: Lactation Services

## 2024-08-05 NOTE — Telephone Encounter (Signed)
 Mom left a voice mail on 8/7 at 1308, I do not know if her call was returned, I called her tonight, there was no answer, so I left a voice mail to call us  back if she still needed assistance

## 2024-08-08 ENCOUNTER — Encounter: Payer: Self-pay | Admitting: Obstetrics and Gynecology

## 2024-08-25 NOTE — H&P (Signed)
     Postpartum Examination:      Ms. Gilpin is a 38 y.o. female here for postpartum visit. 2weeks pp   C/o continue bleeding with increased with breast feeding    Pt c/o some abnormal vaginal d/c . Was tx for BV in pregnancy  Interested in PP l/s  sterilization  G9 P7 s/p salpingectomy in past for ectopic  Svd x 5 and c/s x 1   OB/GYN History:  OB History       Gravida  9   Para  6   Term  5   Preterm  1   AB  3   Living  7        SAB  1   IAB  1   Ectopic  1   Molar      Multiple  1   Live Births  7           Recent pregnancy   Date of delivery:07/25/24   Breast Feeding: [x]  Yes  []  No Menses: Last Pap: last yr - no records  Rubella Immune: []  Yes  []  No Edinburgh Depression Screening Score: Hospital 2 on d/c , now 7       Exam:       Vitals:    08/30/24 1616  BP: 123/66  Pulse: 76      Body mass index is 31.37 kg/m.   WDWN white/  female in NAD   Lungs: CTA  CV : RRR without murmur   Neck:  no thyromegaly Abdomen: soft , no mass, normal active bowel sounds,  non-tender, no rebound tenderness Pelvic: tanner stage 5 ,  External genitalia: vulva /labia no lesions Urethra: no prolapse Vagina: normal physiologic d/c wet mount :  Cervix: no lesions, no cervical motion tenderness   Uterus: normal size shape and contour, non-tender Adnexa: no mass,  non-tender   Rectovaginal:  Pelvic exam done  Chaperone present Exam done with Pa student wyner   Impression:    The primary encounter diagnosis was Leukorrhea. A diagnosis of Postpartum hemorrhage, unspecified type (HHS-HCC) was also pertinent to this visit.    elective sterilization    Plan:       Contraception Counseling given. Schedule for L/S sterilization - unilateral salpingectomy ( pt unsure which tube was removed with ectopic)   risks discussed see [facility] records     [facility] CLARYCE DINSMORE, MD

## 2024-09-08 ENCOUNTER — Encounter
Admission: RE | Admit: 2024-09-08 | Discharge: 2024-09-08 | Disposition: A | Discharge status: Home / Self Care | Source: Ambulatory Visit | Attending: Obstetrics and Gynecology | Admitting: Obstetrics and Gynecology

## 2024-09-08 ENCOUNTER — Other Ambulatory Visit: Payer: Self-pay

## 2024-09-08 HISTORY — DX: Anemia, unspecified: D64.9

## 2024-09-08 NOTE — Patient Instructions (Signed)
 Your procedure is scheduled on: 09/15/24 - Friday Report to the Registration Desk on the 1st floor of the [facility]. To find out your arrival time, please call [phone] between 1PM - 3PM on: 09/14/24 - Thursday If your arrival time is 6:00 am, do not arrive before that time as the Medical Mall entrance doors do not open until 6:00 am.  REMEMBER: Instructions that are not followed completely may result in serious medical risk, up to and including death; or upon the discretion of your surgeon and anesthesiologist your surgery may need to be rescheduled.  Do not eat food after midnight the night before surgery.  No gum chewing or hard candies.  You may however, drink CLEAR liquids up to 2 hours before you are scheduled to arrive for your surgery. Do not drink anything within 2 hours of your scheduled arrival time.  Clear liquids include: - water  - apple juice without pulp - gatorade (not RED colors) - black coffee or tea (Do NOT add milk or creamers to the coffee or tea) Do NOT drink anything that is not on this list.   In addition, your doctor has ordered for you to drink the provided:  Ensure Pre-Surgery Clear Carbohydrate Drink  Drinking this carbohydrate drink up to two hours before surgery helps to reduce insulin resistance and improve patient outcomes. Please complete drinking 2 hours before scheduled arrival time.  One week prior to surgery: Stop Anti-inflammatories (NSAIDS) such as Advil , Aleve, Ibuprofen , Motrin , Naproxen, Naprosyn and Aspirin  based products such as Excedrin, Goody's Powder, BC Powder. You may take Tylenol  if needed for pain up until the day of surgery.  Stop ANY OVER THE COUNTER supplements until after surgery : VITAMIN D    ON THE DAY OF SURGERY ONLY TAKE THESE MEDICATIONS WITH SIPS OF WATER:  none  No Alcohol for 24 hours before or after surgery.  No Smoking including e-cigarettes for 24 hours before surgery.  No chewable tobacco products for  at least 6 hours before surgery.  No nicotine patches on the day of surgery.  Do not use any recreational drugs for at least a week (preferably 2 weeks) before your surgery.  Please be advised that the combination of cocaine and anesthesia may have negative outcomes, up to and including death. If you test positive for cocaine, your surgery will be cancelled.  On the morning of surgery brush your teeth with toothpaste and water, you may rinse your mouth with mouthwash if you wish. Do not swallow any toothpaste or mouthwash.  Use CHG Soap or wipes as directed on instruction sheet.  Do not wear jewelry, make-up, hairpins, clips or nail polish.  For welded (permanent) jewelry: bracelets, anklets, waist bands, etc.  Please have this removed prior to surgery.  If it is not removed, there is a chance that hospital personnel will need to cut it off on the day of surgery.  Do not wear lotions, powders, or perfumes.   Do not shave body hair from the neck down 48 hours before surgery.  Contact lenses, hearing aids and dentures may not be worn into surgery.  Do not bring valuables to the hospital. [facility] is not responsible for any missing/lost belongings or valuables.   Notify your doctor if there is any change in your medical condition (cold, fever, infection).  Wear comfortable clothing (specific to your surgery type) to the hospital.  After surgery, you can help prevent lung complications by doing breathing exercises.  Take deep breaths  and cough every 1-2 hours. Your doctor may order a device called an Incentive Spirometer to help you take deep breaths.  When coughing or sneezing, hold a pillow firmly against your incision with both hands. This is called "splinting." Doing this helps protect your incision. It also decreases belly discomfort.  If you are being admitted to the hospital overnight, leave your suitcase in the car. After surgery it may be brought to your room.  In case of  increased patient census, it may be necessary for you, the patient, to continue your postoperative care in the Same Day Surgery department.  If you are being discharged the day of surgery, you will not be allowed to drive home. You will need a responsible individual to drive you home and stay with you for 24 hours after surgery.   If you are taking public transportation, you will need to have a responsible individual with you.  Please call the Pre-admissions Testing Dept. at [phone] if you have any questions about these instructions.  Surgery Visitation Policy:  Patients having surgery or a procedure may have two visitors.  Children under the age of 38 must have an adult with them who is not the patient.  Inpatient Visitation:    Visiting hours are 7 a.m. to 8 p.m. Up to four visitors are allowed at one time in a patient room. The visitors may rotate out with other people during the day.  One visitor age 42 or older may stay with the patient overnight and must be in the room by 8 p.m.   Merchandiser, retail to address health-related social needs:  [email or web address]                                                                                                             Preparing for Surgery with CHLORHEXIDINE GLUCONATE (CHG) Soap  Chlorhexidine Gluconate (CHG) Soap  o An antiseptic cleaner that kills germs and bonds with the skin to continue killing germs even after washing  o Used for showering the night before surgery and morning of surgery  Before surgery, you can play an important role by reducing the number of germs on your skin.  CHG (Chlorhexidine gluconate) soap is an antiseptic cleanser which kills germs and bonds with the skin to continue killing germs even after washing.  Please do not use if you have an allergy to CHG or antibacterial soaps. If your skin becomes reddened/irritated stop using the CHG.  1. Shower the NIGHT BEFORE  SURGERY and the MORNING OF SURGERY with CHG soap.  2. If you choose to wash your hair, wash your hair first as usual with your normal shampoo.  3. After shampooing, rinse your hair and body thoroughly to remove the shampoo.  4. Use CHG as you would any other liquid soap. You can apply CHG directly to the skin and wash gently with a scrungie or a clean washcloth.  5. Apply the CHG soap to your body only from the neck down. Do not use on open wounds or  open sores. Avoid contact with your eyes, ears, mouth, and genitals (private parts). Wash face and genitals (private parts) with your normal soap. Avoid using on the Breast if you are breastfeeding.  6. Wash thoroughly, paying special attention to the area where your surgery will be performed.  7. Thoroughly rinse your body with warm water.  8. Do not shower/wash with your normal soap after using and rinsing off the CHG soap.  9. Pat yourself dry with a clean towel.  10. Wear clean pajamas to bed the night before surgery.  12. Place clean sheets on your bed the night of your first shower and do not sleep with pets.  13. Shower again with the CHG soap on the day of surgery prior to arriving at the hospital.  14. Do not apply any deodorants/lotions/powders.  15. Please wear clean clothes to the hospital.

## 2024-09-11 ENCOUNTER — Encounter: Payer: Self-pay | Admitting: Urgent Care

## 2024-09-11 ENCOUNTER — Inpatient Hospital Stay
Admission: RE | Admit: 2024-09-11 | Discharge: 2024-09-11 | Disposition: A | Discharge status: Home / Self Care | Source: Ambulatory Visit

## 2024-09-11 NOTE — Pre-Procedure Instructions (Signed)
 Attempted to contact patient several times but unable to make contact.

## 2024-09-15 ENCOUNTER — Ambulatory Visit: Admission: RE | Admit: 2024-09-15 | Source: Home / Self Care | Admitting: Obstetrics and Gynecology

## 2024-09-15 ENCOUNTER — Encounter: Admission: RE | Payer: Self-pay | Source: Home / Self Care

## 2024-09-15 DIAGNOSIS — Z01818 Encounter for other preprocedural examination: Secondary | ICD-10-CM

## 2024-09-15 SURGERY — SALPINGECTOMY, UNILATERAL, LAPAROSCOPIC
Anesthesia: General | Laterality: Right

## 2024-10-26 ENCOUNTER — Ambulatory Visit: Admitting: Professional Counselor

## 2024-10-30 ENCOUNTER — Ambulatory Visit (INDEPENDENT_AMBULATORY_CARE_PROVIDER_SITE_OTHER): Admitting: Professional Counselor

## 2024-10-30 DIAGNOSIS — F3162 Bipolar disorder, current episode mixed, moderate: Secondary | ICD-10-CM | POA: Diagnosis not present

## 2024-10-30 DIAGNOSIS — F4310 Post-traumatic stress disorder, unspecified: Secondary | ICD-10-CM
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
## 2024-11-06 ENCOUNTER — Ambulatory Visit (INDEPENDENT_AMBULATORY_CARE_PROVIDER_SITE_OTHER): Admitting: Professional Counselor

## 2024-11-06 DIAGNOSIS — F3162 Bipolar disorder, current episode mixed, moderate: Secondary | ICD-10-CM | POA: Diagnosis not present

## 2024-11-06 DIAGNOSIS — F4310 Post-traumatic stress disorder, unspecified: Secondary | ICD-10-CM | POA: Diagnosis not present
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
## 2024-11-13 ENCOUNTER — Ambulatory Visit (INDEPENDENT_AMBULATORY_CARE_PROVIDER_SITE_OTHER): Admitting: Professional Counselor

## 2024-11-13 DIAGNOSIS — F3162 Bipolar disorder, current episode mixed, moderate: Secondary | ICD-10-CM

## 2024-11-13 DIAGNOSIS — F4310 Post-traumatic stress disorder, unspecified: Secondary | ICD-10-CM | POA: Diagnosis not present
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
## 2024-11-22 ENCOUNTER — Ambulatory Visit (INDEPENDENT_AMBULATORY_CARE_PROVIDER_SITE_OTHER): Admitting: Professional Counselor

## 2024-11-22 DIAGNOSIS — F3162 Bipolar disorder, current episode mixed, moderate: Secondary | ICD-10-CM

## 2024-11-22 NOTE — Progress Notes (Signed)
 THERAPIST PROGRESS NOTE  Virtual Visit via Video Note  I connected with [name] on 11/22/24 at  9:00 AM EST by a video enabled telemedicine application and verified that I am speaking with the correct person using two identifiers.  Location: Patient: Home Provider: Office   I discussed the limitations of evaluation and management by telemedicine and the availability of in person appointments. The patient expressed understanding and agreed to proceed.  I discussed the assessment and treatment plan with the patient. The patient was provided an opportunity to ask questions and all were answered. The patient agreed with the plan and demonstrated an understanding of the instructions.   The patient was advised to call back or seek an in-person evaluation if the symptoms worsen or if the condition fails to improve as anticipated.  I provided 48 minutes of non-face-to-face time during this encounter. [name], [facility]  Session Time: 9:10 AM - 9:58 AM  Participation Level: Active  Behavioral Response: Casual, Alert, Anxious  Type of Therapy: Individual Therapy  Treatment Goals addressed: Active BH CCP BIPOLAR DISORDER LTG: When I start seeing me be stable at a job. Like a healthy relationship with my kids. I don't have to yell at them. Just parenting skills, being more patient with them. There's days I won't shower or brush my teeth. So just regulating my emotions. (Progressing)                Start:  01/18/24    Expected End:  01/16/25    Goal Note Reviewed 10/30/24 - So that part, I still have to work on, not yelling at the kids. I do apologize. I have brushed my teeth more often and showered more. I am taking care of myself more. I feel better. But I do need to with the kids. I just need to fix my house. One thing that has come up a lot. The house is in terrible condition.   STG: To improve relationship satisfaction AEB identifying healthy factors within a relationship  and setting healthy boundaries over the next 12 weeks.  (Progressing)  Goal Note Reviewed 10/30/24 - I think that I'm doing better as far as, I just don't impose myself on people anymore. I just don't ask or expect much so I feel like that's setting boundaries and not let people use me or feel negative.    STG: Will work on achieving personal goals with boxing to build mastery and sense of self. (Not Progressing)  Goal Note Reviewed 10/30/24 - I guess it's still a dream of mine. I still aspire to box one day and I'll continue when she's old enough. When she takes the bottle.    STG: [name] will identify coping strategies to deal with trauma memories and the associated emotional reaction (Progressing)  Goal Note Reviewed 10/30/24 -  Not smoking weed has helped a lot. It was not a good thing. Psychologically, I think it did a lot of damage. I just have to accept and I have accepted a lot of things now.   ProgressTowards Goals: Progressing  Interventions: Motivational Interviewing and Supportive  Summary: [name] is a 38 y.o. female who presents with a history of bipolar disorder. She appeared anxious but oriented x5. She reported she is home with the kids today due to the upcoming holiday. [name] noted she doesn't celebrate holidays but will take the kids to their dad's house for them to celebrate with him. She discussed current dynamics with him and also with  her mother. She explored ways to set boundaries within their relationships.   Therapist Response: Conducted session with [name]. Began session with check-in/update since previous session. Utilized empathetic and reflective listening. Used open-ended questions to facilitate discussion and summarized [name]'s thoughts/feelings. Explored ways to set boundaries to improve dynamics with co-parenting. Scheduled additional appointment and concluded session.   Suicidal/Homicidal: No  Plan: Return again in 1 week.  Diagnosis: Bipolar  1 disorder, mixed, moderate (HCC)  Collaboration of Care: N/A - Not currently engaged in medication management  Patient/Guardian was advised Release of Information must be obtained prior to any record release in order to collaborate their care with an outside provider. Patient/Guardian was advised if they have not already done so to contact the registration department to sign all necessary forms in order for us  to release information regarding their care.   Consent: Patient/Guardian gives verbal consent for treatment and assignment of benefits for services provided during this visit. Patient/Guardian expressed understanding and agreed to proceed.   [name], [facility] 11/22/2024

## 2024-12-01 ENCOUNTER — Ambulatory Visit: Admitting: Professional Counselor

## 2024-12-29 ENCOUNTER — Ambulatory Visit: Payer: Self-pay

## 2024-12-29 NOTE — Telephone Encounter (Signed)
 FYI Only or Action Required?: FYI only for provider: UC advised.  Patient was last seen in primary care on unknown.  Called Nurse Triage reporting Vaginal Bleeding.  Symptoms began today.  Interventions attempted: Nothing.  Symptoms are: stable.  Triage Disposition: See Physician Within 24 Hours  Patient/caregiver understands and will follow disposition?: Yes                                  1. SYMPTOM: What's the main symptom you're concerned about? (e.g., pain, itching, dryness)     Vaginal spotting and irrigation 3. ONSET: When did the symptoms start?     1 day ago 4. PAIN: Is there any pain? If Yes, ask: How bad is it? (Scale: 1-10; mild, moderate, severe)     Yes, describes pain as irritation and inflammation 5. ITCHING: Is there any itching? If Yes, ask: How bad is it? (Scale: 1-10; mild, moderate, severe)     Denies 6. CAUSE: What do you think is causing the discharge? Have you had the same problem before? What happened then?     Unsure, states she has been on depo for 10 years and never experienced a menstrual cycle while on this form of birth control, expresses concern about possible STD 7. OTHER SYMPTOMS: Do you have any other symptoms? (e.g., fever, itching, vaginal bleeding, pain with urination, injury to genital area, vaginal foreign body)     Stomachache/pressure, reports vague foul odor Denies additional discharge, denies vaginal bulging, denies heavy bleeding 8. PREGNANCY: Is there any chance you are pregnant? When was your last menstrual period?     Currently breastfeeding    This RN advised UC for evaluation of symptoms. This RN also assisted patient with scheduling NP appointment to establish care.   Reason for Disposition  [1] MILD to MODERATE vaginal pain AND [2] present > 24 hours  (Exception: Chronic pain.)  Protocols used: Vaginal Symptoms-A-AH  Reason for Triage: Needed to be checked for  std, didn't have a provider she needed to see but wanted to get checked today with anyone. Has been spotting when wipe, is on depo shot and has an oder, achy stomach, no pain or itch below Cb [phone]

## 2025-02-05 ENCOUNTER — Ambulatory Visit: Admitting: General Practice
